# Patient Record
Sex: Male | Born: 1940 | Race: Black or African American | Hispanic: No | Marital: Married | Smoking: Light tobacco smoker
Health system: Southern US, Community
[De-identification: ages and names within clinical notes are randomized; demographics above are authoritative.]

## PROBLEM LIST (undated history)

## (undated) DIAGNOSIS — E785 Hyperlipidemia, unspecified: Secondary | ICD-10-CM

## (undated) DIAGNOSIS — K5790 Diverticulosis of intestine, part unspecified, without perforation or abscess without bleeding: Secondary | ICD-10-CM

## (undated) DIAGNOSIS — Z860100 Personal history of colon polyps, unspecified: Secondary | ICD-10-CM

## (undated) DIAGNOSIS — H409 Unspecified glaucoma: Secondary | ICD-10-CM

## (undated) DIAGNOSIS — G4730 Sleep apnea, unspecified: Secondary | ICD-10-CM

## (undated) DIAGNOSIS — K219 Gastro-esophageal reflux disease without esophagitis: Secondary | ICD-10-CM

## (undated) DIAGNOSIS — K118 Other diseases of salivary glands: Secondary | ICD-10-CM

## (undated) DIAGNOSIS — T7840XA Allergy, unspecified, initial encounter: Secondary | ICD-10-CM

## (undated) DIAGNOSIS — F329 Major depressive disorder, single episode, unspecified: Secondary | ICD-10-CM

## (undated) DIAGNOSIS — F419 Anxiety disorder, unspecified: Secondary | ICD-10-CM

## (undated) DIAGNOSIS — F4310 Post-traumatic stress disorder, unspecified: Secondary | ICD-10-CM

## (undated) DIAGNOSIS — G4700 Insomnia, unspecified: Secondary | ICD-10-CM

## (undated) DIAGNOSIS — Z9289 Personal history of other medical treatment: Secondary | ICD-10-CM

## (undated) DIAGNOSIS — M1990 Unspecified osteoarthritis, unspecified site: Secondary | ICD-10-CM

## (undated) DIAGNOSIS — I10 Essential (primary) hypertension: Secondary | ICD-10-CM

## (undated) DIAGNOSIS — M549 Dorsalgia, unspecified: Secondary | ICD-10-CM

## (undated) DIAGNOSIS — K922 Gastrointestinal hemorrhage, unspecified: Secondary | ICD-10-CM

## (undated) DIAGNOSIS — F32A Depression, unspecified: Secondary | ICD-10-CM

## (undated) DIAGNOSIS — R350 Frequency of micturition: Secondary | ICD-10-CM

## (undated) HISTORY — PX: PARTIAL COLECTOMY: SHX5273

## (undated) HISTORY — PX: COLONOSCOPY: SHX174

## (undated) HISTORY — DX: Allergy, unspecified, initial encounter: T78.40XA

---

## 1999-12-19 ENCOUNTER — Emergency Department (HOSPITAL_COMMUNITY): Admission: EM | Admit: 1999-12-19 | Discharge: 1999-12-19 | Payer: Self-pay | Admitting: Emergency Medicine

## 2000-12-06 ENCOUNTER — Emergency Department (HOSPITAL_COMMUNITY): Admission: EM | Admit: 2000-12-06 | Discharge: 2000-12-06 | Payer: Self-pay | Admitting: Emergency Medicine

## 2000-12-06 ENCOUNTER — Encounter: Payer: Self-pay | Admitting: Emergency Medicine

## 2001-03-03 ENCOUNTER — Encounter: Payer: Self-pay | Admitting: Emergency Medicine

## 2001-03-03 ENCOUNTER — Inpatient Hospital Stay (HOSPITAL_COMMUNITY): Admission: EM | Admit: 2001-03-03 | Discharge: 2001-03-06 | Payer: Self-pay | Admitting: Emergency Medicine

## 2002-09-20 ENCOUNTER — Inpatient Hospital Stay (HOSPITAL_COMMUNITY): Admission: EM | Admit: 2002-09-20 | Discharge: 2002-09-23 | Payer: Self-pay | Admitting: Emergency Medicine

## 2002-09-22 ENCOUNTER — Encounter (INDEPENDENT_AMBULATORY_CARE_PROVIDER_SITE_OTHER): Payer: Self-pay | Admitting: Specialist

## 2003-02-10 ENCOUNTER — Inpatient Hospital Stay (HOSPITAL_COMMUNITY): Admission: EM | Admit: 2003-02-10 | Discharge: 2003-02-13 | Payer: Self-pay | Admitting: Emergency Medicine

## 2005-10-02 ENCOUNTER — Emergency Department (HOSPITAL_COMMUNITY): Admission: EM | Admit: 2005-10-02 | Discharge: 2005-10-02 | Payer: Self-pay | Admitting: Emergency Medicine

## 2006-03-16 ENCOUNTER — Inpatient Hospital Stay (HOSPITAL_COMMUNITY): Admission: EM | Admit: 2006-03-16 | Discharge: 2006-03-19 | Payer: Self-pay | Admitting: Emergency Medicine

## 2006-03-16 ENCOUNTER — Ambulatory Visit: Payer: Self-pay | Admitting: Gastroenterology

## 2006-03-20 ENCOUNTER — Inpatient Hospital Stay (HOSPITAL_COMMUNITY): Admission: EM | Admit: 2006-03-20 | Discharge: 2006-03-24 | Payer: Self-pay | Admitting: Emergency Medicine

## 2006-03-23 ENCOUNTER — Encounter (INDEPENDENT_AMBULATORY_CARE_PROVIDER_SITE_OTHER): Payer: Self-pay | Admitting: *Deleted

## 2006-04-01 ENCOUNTER — Ambulatory Visit: Payer: Self-pay | Admitting: Gastroenterology

## 2006-04-06 ENCOUNTER — Ambulatory Visit: Payer: Self-pay | Admitting: Gastroenterology

## 2007-06-01 ENCOUNTER — Encounter: Payer: Self-pay | Admitting: Emergency Medicine

## 2007-06-02 ENCOUNTER — Inpatient Hospital Stay (HOSPITAL_COMMUNITY): Admission: RE | Admit: 2007-06-02 | Discharge: 2007-06-07 | Payer: Self-pay | Admitting: Internal Medicine

## 2007-06-09 ENCOUNTER — Inpatient Hospital Stay (HOSPITAL_COMMUNITY): Admission: EM | Admit: 2007-06-09 | Discharge: 2007-06-14 | Payer: Self-pay | Admitting: Emergency Medicine

## 2007-08-20 ENCOUNTER — Encounter (INDEPENDENT_AMBULATORY_CARE_PROVIDER_SITE_OTHER): Payer: Self-pay | Admitting: General Surgery

## 2007-08-20 ENCOUNTER — Inpatient Hospital Stay (HOSPITAL_COMMUNITY): Admission: RE | Admit: 2007-08-20 | Discharge: 2007-08-25 | Payer: Self-pay | Admitting: General Surgery

## 2009-08-31 ENCOUNTER — Encounter (INDEPENDENT_AMBULATORY_CARE_PROVIDER_SITE_OTHER): Payer: Self-pay | Admitting: *Deleted

## 2010-02-21 ENCOUNTER — Telehealth: Payer: Self-pay | Admitting: Gastroenterology

## 2011-01-23 NOTE — Progress Notes (Signed)
Summary: Schedule Colonoscopy  Phone Note Outgoing Call Call back at [facility] Phone [phone]   Call placed by: Harlow Mares CMA Duncan Dull),  February 21, 2010 3:31 PM Call placed to: Patient Summary of Call: patients number has been disconnected, we will mail him another letter about his colonoscopy recall . Initial call taken by: Harlow Mares CMA Duncan Dull),  February 21, 2010 3:32 PM

## 2011-05-06 NOTE — Consult Note (Signed)
Jeremiah [name], STOFFERS NO.:  [id number]   MEDICAL RECORD NO.:  [id number]          PATIENT TYPE:  INP   LOCATION:  2901                         FACILITY:  [facility]   PHYSICIAN:  Dr. Janee Morn           DATE OF BIRTH:  [birth year]   DATE OF CONSULTATION:  06/04/2007  DATE OF DISCHARGE:                                 CONSULTATION   CONSULTING SURGEONS:  Dr. Cain Saupe and Dr. Janee Morn.   PRIMARY CARE PHYSICIAN:  In [place].   GI PHYSICIAN:  Iva Boop, MD,FACG   REASON FOR CONSULTATION:  Recurrent diverticular bleed.   HISTORY OF PRESENT ILLNESS:  Jeremiah [name] is a 70 year old very pleasant  male with history of diverticular bleed beginning in 2000.  Several of  these have required inpatient treatment and packed red blood cell  therapy.  He has had colonoscopies which have identified diverticulosis,  but no actual sites of diverticular bleeding.  Dr. Zachery Dakins was  actually called in to evaluate him during a hospitalization in April  2007 because of GI bleeding.  At that admission the patient had been  quiescent, regarding bleeding, for about 2 years until that admission.  The patient was readmitted, at this time, on June 02, 2007 with  recurrent bright red blood and maroon blood per rectum.  The bleeding  has slowed intermittently.  The patient will have brown stools followed  by red-or-maroon stools.  The patient reports at least some amount of  red blood daily per rectum.  He usually reports crampy abdominal pain;  and then has bleeding symptoms.  He has been evaluated by GI this  admission who feel, at this time, that he needs to be observed further;  and is not recommending colonoscopy, EGD of bleeding scan at this point.  The patient's last colonoscopy was in 2007; and, again, demonstrated  diverticulosis without active diverticular bleeding.   Internal medicine is concerned because of the patient's hemoglobin at  admission was 11.4 and has slowly been  trending downward with a  hemoglobin, today, of 8.5.  The patient has received a total of 5 units  of packed red blood cells this admission including 1 unit today; and he  has another unit pending.  Internal medicine is requesting a surgical  evaluation.   REVIEW OF SYSTEMS:  As above.  The patient states that this admission  has been the worst regarding recurrence of bleeding since he has begun  having problems in about 2000.   PAST MEDICAL HISTORY:  1. Hypertension.  2. Osteoarthritis.  The patient denies NSAID use.  3. Diverticulosis with recurrent diverticular bleeds.  No actual      anatomical site identified.   PAST SURGICAL HISTORY:  Colonoscopy with polypectomy.   SOCIAL HISTORY:  Married.  He smokes cigarettes one pack per day.  No  alcohol.   ALLERGIES:  He is allergic to unknown antibiotic.   MEDICATIONS:  The patient is taking Cipro, potassium, Protonix,  Dilaudid, Lopressor, Phenergan, Reglan and Zofran this admission.   PHYSICAL EXAM:  GENERAL:  A pleasant male  patient currently complaining  of a slightly mild crampy abdominal pain, but has not had any bright red  bleeding since about 7:30 this morning.  VITAL SIGNS:  Temperature 97.7, BP 143/90, pulse 96, respirations 20.  NEUROLOGIC:  The patient is alert and oriented x3 moving all extremities  x4.  HEENT:  Head normocephalic.  Sclerae not injected.  NECK:  Supple without adenopathy.  CHEST:  Bilateral lung sounds clear to auscultation.  Respiratory effort  is nonlabored.  CARDIAC:  S1-S2, no rubs, murmurs, thrills, or gallops.  Pulses regular.  Sinus rhythm on the bedside monitor.  Apparently the patient had an  issue with some hypotension and tachycardia while anemic earlier this  hospitalization.  ABDOMEN:  Soft, nondistended.  Active bowel sounds, nontender, no  obvious hepatosplenomegaly, masses, or bruits.  No hernias.  EXTREMITIES:  Symmetrical in appearance.  Without edema, cyanosis, or   clubbing.   LAB:  Admission hemoglobin 11.4 and has slowly trended down until his  hemoglobin is 8.5 today, INR 1.1.   DIAGNOSTICS:  Chest x-ray shows no acute process.   IMPRESSION:  1. Recurrent lower GI bleeding secondary to known diverticular      disease.  2. Blood loss anemia secondary to diverticular disease requiring      packed red blood cells this admission.  3. Hypertension.   PLAN:  We will go ahead and get a tagged red blood cell study today  since the patient appears to have somewhat of an active bleeding.  We  need to locate the anatomic region in the colon of the bleeding.  The  patient continues to bleed this admission; he will need a colectomy this  admission; based on anatomic region; and if the bleeding stops, this tag  cell study will at least help [place] to no the area of colon that needs  resecting for elective colectomy in the future.   If tagged red blood cell study is unrevealing, the patient may need to  go ahead and undergo colonoscopy especially if he continuous to bleed.      Allison L. Rennis Harding, N.P.    ______________________________  Dr. Janee Morn    ALE/MEDQ  D:  06/04/2007  T:  06/05/2007  Job:  [id number]

## 2011-05-06 NOTE — Discharge Summary (Signed)
NAME:  [name], [name] NO.:  [id number]   MEDICAL RECORD NO.:  [id number]          PATIENT TYPE:  INP   LOCATION:  4735                         FACILITY:  [facility]   PHYSICIAN:  Marcellus Scott, MD     DATE OF BIRTH:  1941/01/26   DATE OF ADMISSION:  06/01/2007  DATE OF DISCHARGE:                               DISCHARGE SUMMARY   PRIMARY CARE PHYSICIAN:  Dr. Ventura Sellers, [address], [place],  [place].   Gastroenterologist at [facility].   DISCHARGE DIAGNOSES:  1. Recurrent lower gastrointestinal bleeding, likely diverticular.  2. Acute blood loss anemia.  3. Thrombocytopenia.  4. Leukocytosis.  5. Hypertension.  6. Gastroesophageal reflux disease.   DISCHARGE MEDICATIONS:  1. Prilosec over-the-counter 20 mg p.o. daily.  2. Ferrous sulfate 325 mg p.o. b.i.d.  3. Tylenol Sinus p.r.n.   PROCEDURES:  1. Colonoscopy by Dr. Ewing Schlein, please refer to his procedure notes for      details.  2. Nuclear medicine gastrointestinal bleeding scan on June 13.      Impression, no evidence of GI bleeding by nuclear medicine scan.  3. Chest x-ray on June 10.  Impression is no acute abnormalities.   PERTINENT LABORATORY DATA:  CBC:  Hemoglobin 10.4, hematocrit 30.6,  white blood cell 9.9, platelets 215.   Basic metabolic panel:  Normal with BUN of 4, creatinine 0.99.  Hemoglobin A1C 5.5, hepatic panel remarkable for total protein 5.3 and  albumin 3.0.   CONSULTATIONS:  1. Gastroenterology from [facility], Dr. Ewing Schlein.  2. Surgery, [facility], Dr. Carolynne Edouard.   HOSPITAL COURSE/PATIENT DISPOSITION:  For the details of the initial  part of the admission, please refer to the History and Physical note  that was done by Dr. Darnelle Catalan.  In summary, Jeremiah [name] is a pleasant 66-year-  old African American male patient with history of recurrent GI bleeding  secondary to diverticular disease with similar hospitalizations in the  past secondary to rectal bleeding who  presented with bright red bleeding  per rectum.  He also was symptomatic with dizziness, presyncope and  generalized weakness.  He was thereby admitted to the hospital for  further evaluation and management.   #1 - RECURRENT GASTROINTESTINAL BLEED, LIKELY DIVERTICULAR IN ORIGIN.  The patient was initially admitted to the telemetry bed.  His H&H's were  monitored frequently.  Two large bore IV accesses were placed.  Gastroenterology was consulted.  The patient was placed on a Proton pump  inhibitor.  He was resuscitated with IV fluids and also was transfused  packed red blood cells.  However, after transfusion of four units of  packed red blood cells, the patient continued to bleed and continued to  complain of generalized weakness, dizziness and was noted to be preshock  in nature.  The patient was therefore transferred to the stepdown unit.  He was made n.p.o.  He got a total transfusion of 8 units of packed red  blood cells.  GI and surgery continued to follow the patient.  GI  subsequently did a colonoscopy which revealed small internal and  external hemorrhoids, two  distal to mid tiny sigmoid diverticular seen  without clot or signs of bleeding, increased cecum and ascending  diverticula, small polyp in the proximal descending which is not  significant, ileocecal valve with minimal inflammation, otherwise,  normal limits to the terminal ileum without any active bleeding seen or  any clots of adverse lesions.  Very minimal blood in the colon, but it  was a little more pronounced in the left than the right of questionable  significance.  At this point, the patient was transitioned to clear  liquids and his H&H's were frequently monitored.  The patient had a BM  which was soft, brown stools, and since two days, the patient has not  had any further BM.  His H&H's continued to initially drop slightly, but  have come up and have remained stable.  The patient is asymptomatic this  time, and  will be discharged on iron supplements.  He is advised to seek  immediate medical attention if he has any further similar events.  He is  also to follow up with his primary medical doctor with repeat CBC later  this week.  The patient also has to follow up with GI in a year's time  for another colonoscopy.  If the patient continues to have these  repeated episodes he will eventually need hemicolectomy or colectomy.   #2 - ACUTE BLOOD LOSS ANEMIA SECONDARY TO GI BLEED.  Status post 8 units  of packed red blood cells.  His hemoglobin can be monitored as an  outpatient in a couple of days, and the patient will be placed on iron  sulfate.   #3 - THROMBOCYTOPENIA.  The patient's platelets had dropped in the 130s.  However, this was felt to be secondary to his multiple packed red blood  cell transfusions and they have come up and stabilized.   #4 - LEUKOCYTOSIS WHICH IS PROBABLY STRESS RELATED WITH NO CLINICAL  FOCUS OF SEPSIS.  However, the patient was briefly placed on  Ciprofloxacin, and that has been discontinued.   #5 - HYPERTENSION.  The patient's home antihypertensive medications were  held and his blood pressures were currently normal.  His blood pressure  medications will continue to be held until he is followed up as an  outpatient .   #6 - GASTROESOPHAGEAL REFLUX DISEASE.  The patient is to continue PPIs  as an outpatient.      Marcellus Scott, MD  Electronically Signed     AH/MEDQ  D:  06/07/2007  T:  06/07/2007  Job:  [id number]   cc:   Nunzio Cory, [place] Dr. Farris Has [place]  Petra Kuba, M.D.  Ollen Gross. Vernell Morgans, M.D.

## 2011-05-06 NOTE — H&P (Signed)
Jeremiah [name], Jeremiah [name] NO.:  [id number]   MEDICAL RECORD NO.:  [id number]          PATIENT TYPE:  INP   LOCATION:  5715                         FACILITY:  [facility]   PHYSICIAN:  Hillery Aldo, M.D.   DATE OF BIRTH:  1941/08/01   DATE OF ADMISSION:  06/09/2007  DATE OF DISCHARGE:                              HISTORY & PHYSICAL   PRIMARY CARE PHYSICIAN:  Unassigned.   GASTROENTEROLOGIST:  Iva Boop, MD,   CHIEF COMPLAINT:  Recurrent rectal bleeding.   HISTORY OF PRESENT ILLNESS:  The patient is a 70 year old male with  known history of diverticulosis who was recently admitted to [facility] from June 01, 2007, through June 07, 2007, for evaluation of  the lower GI bleed.  During that admission, he had a total of 8 units of  packed red blood cells for his rectal bleeding and was evaluated by both  the gastroenterologist and by general surgery.  His workup included a  colonoscopy which showed diverticulosis and some stigmata of recent  bleeding but no active bleeding.  He also had a tagged RBC scan which  did not show a source of the blood loss.  According to laboratory data,  his discharge hemoglobin was 10.4.  He now returns with blood in his  stool starting today.  There has been some clots as well.  The patient  states that he has not moved his bowels in several days and took an  herbal laxative.  He experienced some abdominal cramping and now has  blood in the stool.  He denies any dizziness or presyncope.  Given the  seriousness of his prior lower GI bleed, we are admitting him for  further evaluation.   FAMILY HISTORY:  Reviewed and unchanged.   SOCIAL HISTORY:  Reviewed and unchanged.   ALLERGIES:  No known drug allergies except for an UNSPECIFIED ANTIBIOTIC  which causes nausea and vomiting.   CURRENT MEDICATIONS:  1. Prilosec 20 mg daily.  2. Ferrous sulfate 325 mg b.i.d.  3. Tylenol Sinus p.r.n.  4. Herbal laxative p.r.n.  5.  Zestoretic 20/25 one tablet daily.   REVIEW OF SYSTEMS:  The patient denies any fever or chills.  There has  not been any chest pain, shortness of breath, dysuria, or other  symptoms.   PHYSICAL EXAMINATION:  VITAL SIGNS:  Temperature 96.5, pulse 137,  respirations 18, blood pressure 156/101, O2 saturation 99% on room air.  GENERAL:  Well-developed, well-nourished male in no acute distress.  HEENT: Normocephalic, atraumatic.  PERRL.  EOMI.  Oropharynx is clear.  NECK:  Supple, no thyromegaly, no lymphadenopathy, no jugular venous  distension.  CHEST:  Lungs clear to auscultation bilaterally with good air movement.  HEART:  Tachycardiac rate, regular rhythm.  No murmurs, rubs, or  gallops.  ABDOMEN:  Soft.  There are hyperactive bowel sounds.  Nontender.  EXTREMITIES:  No clubbing, edema, cyanosis.  SKIN:  Warm and dry.  No rashes.  NEUROLOGIC:  The patient is alert and oriented x3.  Cranial nerves II-  XII grossly intact.  Nonfocal.   DATA REVIEW.:  White blood cell count is 8.5, hemoglobin 8.7, hematocrit  26.1, platelets 247 with an MCV of 93.1.  Sodium is 138, potassium 3.3,  chloride 108, bicarb 25, BUN 9, creatinine 1.15, glucose 136, total  protein 5.0, albumin 2.7, AST 19, ALT 16, total bilirubin 0.4, alkaline  phosphatase 45.  Coagulation studies are normal.   ASSESSMENT/PLAN:  1. Recurrent gastrointestinal bleed:  We will admit the patient and      start him on fluid volume resuscitation.  I will check a CBC q.6 h      and transfuse if needed.  Will start two large bore IVs and monitor      him closely for signs of decompensation or hemodynamic instability.      We will reconsult gastroenterology and general surgery in the      morning for consideration of further treatment options.  At this      time, he will likely need a colectomy given his recurrent problems      with GI bleeding.  Will keep him n.p.o. and put him on IV proton      pump inhibitor therapy.  2.  Hypokalemia:  Likely due to GI losses.  Will replete in his IV      fluids.  3. Hypertension:  Will hold the patient's antihypertensives for now.  4. Prophylaxis:  Initiate GI prophylaxis with Protonix and DVT      prophylaxis PAS hose.      Hillery Aldo, M.D.  Electronically Signed     CR/MEDQ  D:  06/09/2007  T:  06/09/2007  Job:  [id number]   cc:   Iva Boop, MD,FACG

## 2011-05-06 NOTE — H&P (Signed)
NAMEMORY, HERRMAN NO.:  [id number]   MEDICAL RECORD NO.:  [id number]           PATIENT TYPE:   LOCATION:                                 FACILITY:   PHYSICIAN:  Hillery Aldo, M.D.   DATE OF BIRTH:  12/16/1941   DATE OF ADMISSION:  06/01/2007  DATE OF DISCHARGE:                              HISTORY & PHYSICAL   PRIMARY CARE PHYSICIAN:  Unassigned.  The patient attends [employer].   GASTROENTEROLOGIST:  Iva Boop, MD, [facility]   CHIEF COMPLAINT:  Rectal bleeding.   HISTORY OF PRESENT ILLNESS:  The patient is 70 year old male with past  medical history of recurrent lower GI bleeding thought to be due to  diverticular etiology.  He has had similar hospitalizations in the past  for significant brisk rectal bleeding.  The patient presents today with  dark stool that turned bright red and a bloody over the past several  hours beginning at 6:30 p.m. tonight.  Since being admitted to the  emergency department, he has had three bloody stools here.  The patient  does endorse presyncopal dizziness but has not actually had any syncopal  events.  He feels weak.  He is being admitted for stabilization and  consultation with gastroenterology   PAST MEDICAL HISTORY:  1. Hypertension.  2. Diverticulosis with diverticular bleeding and three      hospitalizations in the past.  3. Gastroesophageal reflux disease with esophageal stricture.  4. History of hyperplastic colon polyps.  5. Acute blood loss anemia requiring blood transfusions.  6. Osteoarthritis.  7. Chronic allergic sinusitis.   FAMILY HISTORY:  The patient's mother died at age 43 from complications  of childbirth.  The patient's father died in his 41s from what he thinks  was a stroke.  He has two brothers who are deceased.  Both of them had  diabetes, and he thinks one had a massive heart attack.  Three sisters  that are alive and healthy.   SOCIAL HISTORY:  The patient is married and lives with his  wife in  [place].  He is retired from custodial work.  He smokes about a pack  of cigarettes every week.  He rarely drinks alcohol.  He has six healthy  offspring.   ALLERGIES:  The patient has no specific drug allergies but did report  that an antibiotic given to him by the [facility] physician did cause nausea and  vomiting as well as diarrhea, and he is, therefore, intolerant to it,  although he could not remember the name of this ANTIBIOTIC.   CURRENT MEDICATIONS:  1. Zestoretic 20/25 1 tablet daily.  2. Tylenol Cold and Sinus as needed.   REVIEW OF SYSTEMS:  The patient's appetite has been good.  He has had  steady weight gain over time.  He denies any chest pain or shortness of  breath.  He has chronic sinus congestion and cough related to postnasal  drip.  He is chronically constipated and uses Metamucil for this.  Up  until this recent episode, his bowels have been moving normally for him.  He  denies any nausea or vomiting.  No dysuria.   PHYSICAL EXAMINATION:  VITAL SIGNS:  Temperature 98.1, pulse 88,  respirations 18, blood pressure 129/81, O2 saturation 98% on 2 liters.  General:  Well-developed, well-nourished male in no acute distress.  HEENT:  Normocephalic, atraumatic.  PERRLA.  EOMI.  Oropharynx is clear  with moist mucous membranes.  NECK:  Supple, no thyromegaly, no lymphadenopathy, no jugular venous  distension.  CHEST:  Lungs clear to auscultation bilaterally with good air movement.  HEART:  Regular rate and rhythm.  No murmurs, rubs, or gallops.  ABDOMEN:  Soft, nontender, nondistended with somewhat hyperactive bowel  sounds.  No masses.  EXTREMITIES:  No clubbing, edema, or cyanosis.  SKIN:  Warm and dry.  No rashes.  NEUROLOGIC:  The patient is alert and oriented x3.  Cranial nerves II-  XII grossly tact.  Moves all extremities x4 with equal strength.  Nonfocal.   DATA REVIEW.:  Chest x-ray is negative for acute changes.   A 12-lead EKG shows normal sinus  rhythm with nonspecific ST wave  abnormalities.   Laboratory data:  White blood cell count is 11.6, hemoglobin 11.4,  hematocrit 32.2, platelets 225 with an MCV of 94.9.  Sodium is 137,  potassium 3.4, chloride 104, bicarb 27, BUN 12, creatinine 1.17, glucose  171, total bilirubin 0.5, total protein 5.3, albumin 3.0, alkaline  phosphatase 37, AST 15, ALT 14.  Lipase is 20.  PT 13.6.   ASSESSMENT/PLAN:  1. Recurrent lower gastrointestinal bleed:  We will admit the patient      and fluid volume resuscitate him.  Will check a CBC q.6 h and      transfuse if needed.  Will start two large bore IVs and monitor him      closely for hemodynamic stability on the step-down unit.  [facility] has already been appraised of the patient's      admission by the ED physician.  Would consider a surgical      consultation for partial colectomy given the patient's recurrent      lower GI bleeding.  We will put him on proton pump inhibitor      therapy and bowel rest.  2. Hypokalemia:  This is likely due to GI losses.  Will replete his IV      fluids.  3. Hyperglycemia:  The patient is hyperglycemic, but he has not been      fasting.  Will monitor his CBGs q.6 h for 24 hours and check a      hemoglobin A1c to determine if he has any problems with impaired      glucose tolerance.  4. Hypertension:  Will hold the patient's antihypertensives for now.  5. Prophylaxis:  Will initiate GI prophylaxis with Protonix and DVT      prophylaxis with PAS hose.      Hillery Aldo, M.D.  Electronically Signed     CR/MEDQ  D:  06/01/2007  T:  06/02/2007  Job:  [id number]

## 2011-05-06 NOTE — Op Note (Signed)
NAMEWIRT, HEMMERICH NO.:  [id number]   MEDICAL RECORD NO.:  [id number]          PATIENT TYPE:  INP   LOCATION:  5712                         FACILITY:  [facility]   PHYSICIAN:  Gabrielle Dare. Janee Morn, M.D.DATE OF BIRTH:  08-27-1941   DATE OF PROCEDURE:  08/20/2007  DATE OF DISCHARGE:                               OPERATIVE REPORT   PREOPERATIVE DIAGNOSIS:  Recurrent diverticular hemorrhage, right colon.   POSTOPERATIVE DIAGNOSIS:  Recurrent diverticular hemorrhage, right  colon.   PROCEDURE:  Right colectomy.   SURGEON:  Gabrielle Dare. Janee Morn, M.D.   ANESTHESIA:  General.   HISTORY OF PRESENT ILLNESS:  Mr. Jeremiah [name] is a 70 year old African American  gentleman with a history of hypertension who has had recurrent bouts of  right sided diverticular hemorrhage.  He has undergone medical clearance  for surgery and now presents today for elective colectomy.  He underwent  a colon prep.   PROCEDURE IN DETAIL:  Informed consent was obtained.  The patient  received intravenous antibiotics.  His site was marked after identifying  him in the preoperative holding area, he was brought to the operating  room.  General anesthesia was administered by the anesthesia staff.  His  abdomen was prepped and draped in a sterile fashion.  A midline incision  was made.  Subcutaneous tissues were dissected down revealing the  anterior fascia.  This was divided along the linea alba.  The peritoneal  cavity was entered under direct vision without difficulty.  The fascia  was opened the length of the incision.  Exploration revealed some  adhesions of the omentum down to the cecum.  These were cleared away  with careful dissection.  There were visible diverticula on the cecum.  Once the omentum was freed away, the right colon was then further  inspected.  The diverticula seemed to be localized mostly to the cecum  and very proximal right colon.  The liver was palpated and it was  smooth.  The right  colon was then mobilized from its lateral peritoneal  attachments along the white line of T[place]t and mobilization continued up  and the hepatic flexure was also mobilized.  We protected the duodenum  throughout.  Once adequate mobilization was obtained, an area just  proximal to midline of transverse colon was selected.  The omentum was  cleared away with the LigaSure, getting excellent hemostasis.  The colon  was divided with a GIA-75 stapler.  We then gradually took down the  mesentery with the LigaSure, obtaining excellent hemostasis.  We  completed about two-thirds of the mesenteric division.  Subsequently,  the terminal ileum was divided with the GIA-75 stapler and the remainder  of the mesentery to the cecum and right colon was taken down a LigaSure,  getting excellent hemostasis.  The specimen was passed off and sent to  pathology.  The mesentery was closely inspected and meticulous  hemostasis was ensured.  The abdomen was irrigated.  Right upper  quadrant was checked and there was no hemorrhage.  We then reconnected  the bowel.  We first placed the small bowel back into  anatomic position.  A side-to-side anastomosis was made between the ileum and transverse  colon with GIA-75 stapler.  A crotch stitch of 0 silk suture had been  placed.  The resultant enterotomy was closed with a TA-60 after ensuring  excellent hemostasis along the staple line.  Hemostasis was obtained on  this staple line with one figure-of-eight 3-0 silk.  The anatomosis was  widely patent.  A second crotch stitch of 2-0 silk was placed for  support.  The mesenteric defect was then closed with interrupted 2-0  silk sutures.  We changed our gloves.  We then irrigated the abdomen  with several liters of warm saline.  Irrigation fluid returned clear.  Right upper quadrant was reinspected and hemostasis was present.  The  remainder of the abdomen was irrigated, irrigation fluid returned clear.  The anastomosis was  placed in anatomic position in the upper abdomen.  The remainder of the bowel was placed back in anatomic position.  Sponge, needle and instrument counts were ensured to be correct.  We  checked the right upper quadrant one more time and remained completely  dry.  The abdomen was then closed with 2 lengths of running #1 PDS  suture from each end and tied to the middle.  Subcutaneous tissues were  copiously irrigated and the skin was closed with staples.  Sponge,  needle and instrument counts were again correct.  A sterile dressing was  applied.  The patient tolerated the procedure well without apparent  complication, was taken to the recovery room in stable condition.      Gabrielle Dare Janee Morn, M.D.  Electronically Signed     BET/MEDQ  D:  08/20/2007  T:  08/20/2007  Job:  [id number]   cc:   Deirdre Peer. Polite, M.D.  Graylin Shiver, M.D.

## 2011-05-06 NOTE — Op Note (Signed)
NAMEJEREMIE, Jeremiah [name] NO.:  [id number]   MEDICAL RECORD NO.:  [id number]          PATIENT TYPE:  INP   LOCATION:  2901                         FACILITY:  [facility]   PHYSICIAN:  Petra Kuba, M.D.    DATE OF BIRTH:  [birth year]   DATE OF PROCEDURE:  06/05/2007  DATE OF DISCHARGE:                               OPERATIVE REPORT   PROCEDURE:  Colonoscopy.   INDICATIONS:  Persistent GI bleeding in a patient with known  diverticular disease.   Consent was signed after risks, benefits, methods, options thoroughly  discussed by multiple GI physicians in the past as well as the [facility]  team.   MEDICINES USED:  Fentanyl 100 mcg, Versed 10 mg.   PROCEDURE:  Rectal inspection is pertinent for small external  hemorrhoids.  Digital exam was negative.  The video colonoscope was  inserted and fairly easily advanced around the colon to the cecum.  There was a little bit of old blood in the left side with much less on  the right side but no signs of active bleeding.  To advance to the cecum  required rolling on his back and abdominal pressure.  On insertion, no  abnormalities were seen until we got to the ascending colon where  moderate diverticula were seen.  There was a little bit of inflammation  on the IC valve of questionable significance.  The area was washed and  watched but could not be made to bleed.  It did not look like an AVM.  The cecum was normal which was identified by the appendiceal orifice and  the ileocecal valve.  In fact, the scope was inserted short ways in the  terminal ileum which was normal.  Photo documentation was obtained.  The  scope was slowly withdrawn.  The prep was adequate.  There was some  liquid stool, water and old blood that required washing and suctioning  but on slow withdrawal through the colon, the right-sided diverticula  were moderate.  No obvious clots were seen nor any active or fresh  blood.  The transverse was normal.  In the  more proximal descending, a  small nonsignificant polyp was seen.  Based on multiple rebleeding  episodes, elected not to biopsy at this time.  On slow withdrawal  through the colon, there was a rare distal to mid tiny sigmoid  diverticula seen but no signs of bleeding or any other abnormalities as  we withdrew back to the rectum.  Anorectal pull-through and retroflexion  confirmed some small hemorrhoids.  Scope was straightened, readvanced a  short ways up the left side of the colon.  Air was suctioned, scope  removed.   The patient tolerated the procedure well.  There was no obvious  immediate complication.   ENDOSCOPIC DIAGNOSES:  1. Small internal/external hemorrhoids.  2. Two distal to mid tiny sigmoid diverticula seen without clot or      signs of bleeding.  3. Increased cecum and ascending diverticula.  4. Small polyp in the proximal descending, nonsignificant.  5. Ileocecal valve with minimal inflammation, could not be made to  bleed with washing.  Questionable significance.  6. Otherwise within normal limits to the terminal ileum without any      active bleeding seen nor any clots or adverse lesion.  Very minimal      blood in the colon but it was a little more pronounced in the left      than the right of questionable significance.   PLAN:  I have discussed the case with Dr. Carolynne Edouard.  Clear liquids for now.  Continue to follow H and H.  Would probably recommend a repeat nuclear  scan p.r.n. and probably if surgical options were needed due to  continued bleeding.  Consider a right hemicolectomy only just see how he  does.  Will go ahead and plan a repeat colonoscopy in 1 year to remove  the polyp at that time since it had no significant worrisome lesions.  Will follow with you.  Hold aspirin and other blood thinners and  nonsteroidals, etc.           ______________________________  Petra Kuba, M.D.     MEM/MEDQ  D:  06/05/2007  T:  06/06/2007  Job:  [id number]    cc:   Petra Kuba, M.D.  Olene Craven, M.D.

## 2011-05-06 NOTE — Discharge Summary (Signed)
NAMEVERDELL, DYKMAN NO.:  [id number]   MEDICAL RECORD NO.:  [id number]          PATIENT TYPE:  INP   LOCATION:  5715                         FACILITY:  [facility]   PHYSICIAN:  Isidor Holts, M.D.  DATE OF BIRTH:  Nov 27, 1941   DATE OF ADMISSION:  06/09/2007  DATE OF DISCHARGE:  06/14/2007                         DISCHARGE SUMMARY - REFERRING   PRIMARY CARE PHYSICIAN:  Deirdre Peer. Polite, M.D.   DISCHARGE DIAGNOSES:  1. Extensive diverticular disease.  2. Recurrent lower gastrointestinal bleed presumed of diverticular      origin.  3. History of gastroesophageal reflux disease/esophageal stricture      status post dilatation in the past.  4. Osteoarthritis.  5. Chronic sinusitis.  6. Smoking history.  7. Acute blood loss anemia, required blood transfusion.  8. Hypertension.   DISCHARGE MEDICATIONS:  .  1. Zestoretic (20/25) one p.o. daily.  2. Prilosec 20 mg p.o. daily.  3. Ferrous sulfate 325 mg p.o. b.i.d.   PROCEDURES:  1. Upper GI endoscopy June 10, 2007, performed by Dr. Graylin Shiver.      This showed small hiatal hernia but was otherwise normal.  2. Capsule endoscopy dated June 11, 2007.  This was an entirely normal      examination.   CONSULTATIONS:  1. Graylin Shiver, M.D., gastroenterologist.  2. [place]. Daphine Deutscher, MD, general surgeon.  3. Shirley Friar, MD, gastroenterology.   ADMISSION HISTORY:  As in H&P dated June 09, 2007, dictated by Hillery Aldo, M.D., however, in brief, this is a 70 year old male with known  history of diverticulosis, status post multiple hospitalizations with GI  bleed.  Latest hospitalization was June 01, 2007, to June 07, 2007.  At  that time, he underwent a colonoscopy which demonstrated small internal  and external hemorrhoids, diverticula without clot or signs of active  bleeding, small polyp in the proximal descending colon.  Nuclear  medicine scan done on June 04, 2007, showed no evidence of GI  bleeding.  Past medical history is significant for osteoarthritis, previous  esophageal stricture, status post dilatation, chronic sinusitis, smoking  history. He now represents with recurrent passage of blood in the  stools, and was admitted for further evaluation, investigation and  management.   CLINICAL COURSE:  PROBLEM #1 -  RECURRENT LOWER GASTROINTESTINAL BLEED:  Patient has been extensively evaluated for this in the recent past,  including nuclear medicine scan June 04, 2007, which was negative and a  colonoscopy June 05, 2007, which showed extensive diverticular disease  but no active source of bleeding.  During his last hospitalization, he  required transfusion of about 8 units packed red blood cells.  He now  presents with recurrent symptoms and anemia with hemoglobin 8.7.  However, remained otherwise hemodynamically stable.  He was transfused  with two units packed red blood cells resulting in a satisfactory bump  in hemoglobin level.  As of June 14, 2007, his hemoglobin was 10.4 with  hematocrit 31.  GI consultation was called which was provided by Dr.  Herbert Moors and Dr. Bosie Clos.  Patient underwent upper GI endoscopy on  June 10, 2007, which showed a small hiatal hernia but was otherwise  negative. He underwent capsule endoscopy on June 11, 2007.  This was a  negative study.  It was felt, that in view of the patient's recurrent  symptoms and lack of localized source of bleeding, patient would benefit  from a surgical consultation.  This was kindly provided by Dr. Daphine Deutscher.  Patient is recommended to have a hemicolectomy versus subtotal colectomy  as a definitive procedure.  As patient remained hemodynamically stable,  showed no further evidence of bleeding during the course of his  hospitalization, he was discharged on June 14, 2007, with arrangements  in place to [facility] up with Dr. Daphine Deutscher on an outpatient basis to have  surgery scheduled electively.  Patient is agreeable  with this plan.   PROBLEM #2 -  HYPERTENSION:  Patient's antihypertensives were  discontinued at the time of presentation.  Blood pressure started  creeping up and by June 14, 2007, was 166/90.  Patient's  antihypertensive medication, Zestoretic, which was his pre-admission  medication, has been reinstated accordingly.  He should continue to  follow-up his blood pressure, with his primary M.D.   PROBLEM #3 -  HISTORY OF GASTROESOPHAGEAL REFLUX DISEASE:  Patient  continues on proton pump inhibitor treatment.  There were no symptoms  referable to this, and upper GI endoscopy of June 10, 2007, revealed  hiatal hernia but showed no evidence of esophageal inflammation.   PROBLEM #4 -  SMOKING HISTORY:  Patient has been counseled  appropriately.   PROBLEM #5 -  ACUTE BLOOD LOSS ANEMIA:  Refer to #1 above.  As  mentioned, patient was transfused with 2 units packed red blood cells  resulting in satisfactory bump in hemoglobin to 10.4 as compared to his  pre-admission hemoglobin of 8.7.   DISPOSITION:  Patient was considered sufficiently clinically stable to  be discharged on June 14, 2007.   DIET:  Heart healthy diet.   ACTIVITY:  As tolerated.   FOLLOW UP INSTRUCTIONS:  Patient is to follow up with his primary M.D.,  Dr. Nehemiah Settle, on June 15, 2007, per prior scheduled appointment.  He is to  follow up with Dr. Daphine Deutscher, general surgeon in the coming week, telephone  number [phone].  Patient has been instructed to call for an  appointment.  Also patient is to follow up with Dr. Bosie Clos,  gastroenterologist, in July, telephone number [phone].  All this has  been communicated to the patient.      Isidor Holts, M.D.  Electronically Signed    CO/MEDQ  D:  06/14/2007  T:  06/14/2007  Job:  [id number]   cc:   Deirdre Peer. Polite, M.D.  Shirley Friar, MD  [place] Daphine Deutscher, MD

## 2011-05-06 NOTE — Op Note (Signed)
NAME:  Jeremiah [name], Jeremiah [name] NO.:  [id number]   MEDICAL RECORD NO.:  [id number]          PATIENT TYPE:  INP   LOCATION:  5715                         FACILITY:  [facility]   PHYSICIAN:  Graylin Shiver, M.D.   DATE OF BIRTH:  [birth year]   DATE OF PROCEDURE:  06/10/2007  DATE OF DISCHARGE:                               OPERATIVE REPORT   PROCEDURE:  Esophagogastroduodenoscopy.   INDICATION:  Ongoing gastrointestinal bleeding, rule out upper GI  source.   PREPROCEDURE CONSENT:  Informed consent was obtained after explanation  of the risks of bleeding, infection and perforation.   PREMEDICATIONS:  Fentanyl 50 mcg IV, Versed 7 mg IV.   PROCEDURE:  With the patient in the left lateral decubitus position, the  Pentax gastroscope was inserted into the oropharynx and passed into the  esophagus.  It was advanced down the esophagus, then into the stomach  and into the duodenum.  The second portion and bulb of the duodenum  looked normal.  The stomach revealed a small hiatal hernia, but  otherwise the mucosa in the stomach looked normal.  The esophagus looked  normal.  He tolerated the procedure well without complications.   IMPRESSION:  Small hiatal hernia, otherwise normal upper endoscopy.  There is nothing on this exam to explain gastrointestinal bleeding.   PLAN:  The patient will undergo capsule endoscopy to rule out any  significant abnormalities in the small bowel.   At the present time our feeling is that his ongoing recurrent episodes  of bleeding are most probably of right-sided diverticular origin in the  colon.           ______________________________  Graylin Shiver, M.D.     SFG/MEDQ  D:  06/12/2007  T:  06/12/2007  Job:  [id number]

## 2011-05-06 NOTE — Consult Note (Signed)
Jeremiah [name], Jeremiah [name] NO.:  [id number]   MEDICAL RECORD NO.:  [id number]          PATIENT TYPE:  INP   LOCATION:  0113                         FACILITY:  [facility]   PHYSICIAN:  Shirley Friar, MDDATE OF BIRTH:  September 08, 1941   DATE OF CONSULTATION:  DATE OF DISCHARGE:                                 CONSULTATION   REQUESTING PHYSICIAN:  Dr. Deretha Emory.   PROBLEM:  GI bleed.   HISTORY OF PRESENT ILLNESS:  Jeremiah [name] is a 70 year old black male who is  an unassigned patient being seen due to acute onset of rectal bleeding.  This evening he had acute onset of dark red blood per rectum x3 and 1  episode of bright red blood with associated stool on his last episode;  his last episode was brown with bright red blood.  He denied any  vomiting.  He did have associated dizziness and lightheadedness.  Blood  pressure was 79/43 on arrival with a heart rate of 119.  His hemoglobin  was 11.4.  The patient had a similar episode in 2007, which was thought  to be diverticular in origin.  On colonoscopy in 2007, he had right-  sided diverticulosis without any left-sided diverticulosis.  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  During his last GI bleed, he had a negative blood pool scan  done.  There is a question of whether his bleeding was from an non-  diverticular source, but the exact source could not be identified.  It  was thought to be diverticular in nature.   PAST MEDICAL HISTORY:  History diverticular bleeding, hypertension, and  reported history diverticulitis.   MEDICATIONS:  Zestoretic, Tylenol.   ALLERGIES:  No known drug allergies.   FAMILY HISTORY:  Noncontributory.   SOCIAL HISTORY:  Denies alcohol, positive smoker.   Negative except as stated above.   PHYSICAL EXAM:  VITAL SIGNS:  Temperature 98.1, blood pressure 129/81(on  presentation, blood pressure was 79/43) heart rate 89 and on  presentation, it  was 119.  GENERAL:  Alert, in no acute distress.  CARDIOVASCULAR:  Regular rate and rhythm.  CHEST:  Clear to auscultation anteriorly.  ABDOMEN:  Soft, nontender and nondistended.  Active bowel sounds.  EXTREMITIES:  No edema.   IMPRESSION:  [age]-year-old black male presenting with acute onset  of rectal bleeding which appears be a lower gastrointestinal source.  I  suspect he is having a repeat diverticular bleed based on his  presentation.  His BUN is normal and based on his history, I do not  think this is an upper gastrointestinal source.  He will need a repeat  colonoscopy during this hospitalization and I discussed risks and  benefits of the procedure and he agrees to proceed.  We will do supportive care overnight and plan to prep for colonoscopy on  June 02, 2007 to do the colonoscopy on June 03, 2007.  If his bleeding  persists in the morning of June 02, 2007, he may need a bleeding RBC  scan depending on changes in his hemodynamics.  Shirley Friar, MD  Electronically Signed     VCS/MEDQ  D:  06/01/2007  T:  06/02/2007  Job:  [id number]

## 2011-05-06 NOTE — Consult Note (Signed)
NAME:  ABDULHAMID, OLGIN NO.:  [id number]   MEDICAL RECORD NO.:  [id number]          PATIENT TYPE:  INP   LOCATION:  5715                         FACILITY:  [facility]   PHYSICIAN:  Graylin Shiver, M.D.   DATE OF BIRTH:  Jul 15, 1941   DATE OF CONSULTATION:  06/10/2007  DATE OF DISCHARGE:                                 CONSULTATION   REASON FOR CONSULTATION:  We were asked to see Mr. Grealish today by Dr.  Mikeal Hawthorne of [facility].   HISTORY OF PRESENT ILLNESS:  Mr. Strother is a 70 year old African American  male who was recently in the hospital for rectal bleeding.  He was  admitted on June 10, discharged on June 16.  On June 18, he was  readmitted having maroon stools once again.  He says that when he was  discharged he was eating a full diet within the hospital, but he had not  really had a solid bowel movement since his colonoscopy on June 14.  When he went home, he did have two good meals, one was baked potatoes  and salad.  The very next day, at approximately 2:00 to 3:00 in the  afternoon, he started having maroon bloody bowel movements.  Came to the  ER, had several bloody bowel movements in the ER.  States has not had a  bowel movement since yesterday morning June 18.  Is reporting some  epigastric pain at present and says that he regularly has a large amount  of gas.  On his last hospitalization, he had a colonoscopy that showed  right-sided diverticula, one small nonsignificant polyp in the proximal  descending colon, tiny sigmoid diverticula and small internal and  external hemorrhoids.  No biopsies were taken at that time due to the  recent bleeding.  He also had a nuclear medicine scan on June 04, 2007.  The patient reports that at this time the scan was being done, he was  actively having bloody bowel movements and scan was negative.  He has  not had an upper endoscopy since 2000 when he had an upper endoscopy  with esophageal dilatation Mr. Zaremba was  hospitalized with similar  episodes of rectal bleeding in 2002-2004, twice in 2007, then on June 01, 2007 and now on June 09, 2007.  He says that the episodes this June  have been worse than any previous episodes of bleeding.   PAST MEDICAL HISTORY:  Significant for history of diverticular bleeding,  hypertension and reported history of diverticulitis.   CURRENT MEDICATIONS AT HOME:  1. Zestoretic iron 325 mg.  2. Prilosec 20 mg.  3. Tylenol p.r.n.  4. He is on no anticoagulation medication.   ALLERGIES:  He has no known drug allergies.   FAMILY HISTORY:  Noncontributory.   SOCIAL HISTORY:  He denies alcohol.  He does smoke tobacco.   REVIEW OF SYSTEMS:  Positive only for recurrent bleeding and epigastric  pain as well as left leg pain.   PHYSICAL EXAMINATION:  GENERAL:  He is alert, oriented and in no  apparent distress.  VITAL SIGNS:  Current temperature is 98.2, pulse 74, respirations 18,  blood pressure 139/80.  HEART:  Regular rate and rhythm with no murmurs, rubs or gallops  appreciated.  LUNGS:  Clear to auscultation bilaterally.  ABDOMEN:  Soft, nontender, nondistended with increased bowel sounds.   LABORATORY DATA:  Current labs show a hemoglobin now of 9.8, hematocrit  29.2, white count 7.9, platelets 251.  On admission yesterday,  hemoglobin was 8.7, PT was 13.1, PTT 29.  CMP:  Sodium 138, potassium  3.3, chloride 108, bicarb 25, glucose 136, BUN 9, creatinine 1.15.  LFTs  were normal.  Serum albumin is low at 2.7.  On admission, the patient  was typed and screened.  He has received 2 units of blood, so after 2  units, his hemoglobin has only increased by 1.1 mg.   ASSESSMENT:  GI bleed, unsure of source.  Dr. Wandalee Ferdinand has seen and  examined the patient, collected a history.  He will begin his evaluation  with an upper endoscopy this morning.  If that is negative, he will do a  capsule endoscopy before recommending surgery.      Stephani Police, PA     ______________________________  Graylin Shiver, M.D.    MLY/MEDQ  D:  06/10/2007  T:  06/10/2007  Job:  [id number]   cc:   Lonia Blood, M.D.  Graylin Shiver, M.D.  Petra Kuba, M.D.

## 2011-05-06 NOTE — Discharge Summary (Signed)
NAMEOLEG, OLESON NO.:  [id number]   MEDICAL RECORD NO.:  [id number]          PATIENT TYPE:  INP   LOCATION:  5712                         FACILITY:  [facility]   PHYSICIAN:  Gabrielle Dare. Janee Morn, M.D.DATE OF BIRTH:  [birth year]   DATE OF ADMISSION:  08/20/2007  DATE OF DISCHARGE:  08/25/2007                               DISCHARGE SUMMARY   DISCHARGE DIAGNOSES:  1. Right-sided colon diverticular hemorrhage.  2. Status post right colectomy   HISTORY OF PRESENT ILLNESS:  Mr. 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  He presented for elective colectomy.   HOSPITAL COURSE:  Mr. Seres underwent an uncomplicated right colectomy.  Postoperatively, he remained afebrile and hemodynamically stable.  His  blood pressure remained well controlled on metoprolol and lisinopril  with an occasional extra dose of IV metoprolol.  He had some mild  wheezing.  It was controlled well with  Combivent and respiratory  therapy.  His postoperative ileus gradually resolved, allowing  advancement of his diet starting on postoperative day 3.  He moved his  bowels and was advanced to a regular diet on postoperative day 4.  He  tolerated oral pain medication and is discharged home in stable  condition on postoperative day 5.  The patient's pathology report  revealed multiple right-sided colon diverticula with inflammation and  some mild active hemorrhage.   DISCHARGE DIET:  Regular.  I feel it is safe for him to eat corn as he  likes it very much.  The latest data that supports no correlation with  corn consumption and diverticular disease.  Followup will be with myself  in 1 week.   DISCHARGE MEDICATIONS:  Percocet 5/325 one to two p.o. q.6 hours p.r.n.  pain.   He is also to continue home medications including:  1. Lisinopril/HCTZ 20/25 one p.o. q.a.m.  2. Metoprolol 100 mg p.o. q.a.m.  3. Prilosec  20 mg as needed      Laurell Josephs E. Janee Morn, M.D.  Electronically Signed     BET/MEDQ  D:  08/25/2007  T:  08/25/2007  Job:  [id number]   cc:   Deirdre Peer. Polite, M.D.  Graylin Shiver, M.D.

## 2011-05-09 NOTE — Consult Note (Signed)
NAMEHARUTYUN, MONTEVERDE NO.:  [id number]   MEDICAL RECORD NO.:  [id number]          PATIENT TYPE:  INP   LOCATION:  1604                         FACILITY:  [facility]   PHYSICIAN:  Anselm Pancoast. Weatherly, M.D.DATE OF BIRTH:  Apr 29, 1941   DATE OF CONSULTATION:  03/24/2006  DATE OF DISCHARGE:  03/24/2006                                   CONSULTATION   CHIEF COMPLAINT:  GI bleeding presumably colonic.   HISTORY:  Jeremiah [name] is a 70 year old black male who has mild  hypertension who was just recently admitted for GI bleeding by Dr. Arlyce Dice  and gives a long history of intermittent problems with GI bleeding, the  source of which has not been definitely determined. He states he bled in  2003 and 2004 and it is presumably thought to be colon but has never  definitely a source of the bleeding determined. He was hospitalized this  past week here at [facility] and was evaluated and received 2 units of  blood. The bleeding appeared to stop. I am not sure exactly what test he had  and then was released, but only about 24 hours later he started having frank  bleeding per rectum again. He describes this as dark with clots, not pure  fresh blood but it is certainly described as more colonic bleeding than  duodenal/peptic ulcer type bleeding. He had been admitted by the [facility]  and was discharged but then returned to the emergency room actively bleeding  by ambulance on the 30th and was admitted by Dr. Leone Payor with instructions  to be followed up by Dr. Arlyce Dice the following day. His hemoglobin was 10.1  and at discharge it was 10.9. He was admitted by Dr. Leone Payor, started on  serial hemoglobin and hematocrits and then the next day Dr. Arlyce Dice called me  to see the patient at which time when I saw him on the 30th, he was having a  blood pool scan done in nuclear medicine but the blood pool scan was not  showing any bleeding activity. His white count was 12,400, his electrolytes  were normal and over the next 12 hours with IV fluids there was a  significant drop in his hemoglobin and hematocrit but there was no further  bleeding per rectum. His hematocrit dropped down to 22 and he was transfused  2 units of packed cells by Dr. Virginia Rochester who was actually on call and this brought  his hemoglobin back up to 10.3 and a hematocrit of 30.4. Over the next  basically 48 hours, he was on a clear liquid diet and was not having any  change in his hemoglobin and hematocrit. His high blood pressure which was  not being treated with medication did become elevated again and then after 2  days of clear liquids underwent a colonoscopy yesterday by Dr. Leone Payor and  from the reports I do not think that he found as much diverticulosis, if  any, as had been anticipated from the previous descriptions as this  presumably had been a diverticular bleeding. There was a little polyp  removed and post colonoscopy  he has been started on a diet and his blood  count today is back up to a hemoglobin of approximately 13. I certainly do  not think that we have identified the source of bleeding and if another  acute bleeding episode would occur, I would recommend that he have a repeat  emergency blood pool scan and I think that we missed the opportunity when he  was in the emergency room and it was approximately midnight that if a blood  pool scan had been done immediately upon his arriving at the emergency room  we probably would have been able to identify a source as in when he bleeds  it is massive. The patient is basically asymptomatic, has been evaluated  previously with a colonoscopy 2 years ago. As far as now, I would not worry  about doing an endoscopy capsule or an arteriogram but the patient is aware  that if he does have acute rebleeding that an emergency blood pool scan  needs to be the first and prompt examination. Each of his previous episodes  have been about 2 units of blood lost and his  pressure will kind of drop and  he stops bleeding has been the pattern now over a period of about 3 years  all total. He is on antihypertensive medications which will of course need  to be continued and I think they have actually got him on an H2 blocker. It  is possible that if he would have an ulcer and a Meckel's diverticulum that  the characteristics of his bleeding would be possibly met if it is massive  as when he bleeds it is not pure fresh blood but it is certainly not the old  digested blood like you see from a duodenal ulcer. I do not think that he  has actually had a repeat upper endoscopy at this admission either. His  chart says he has a history of an esophageal stricture, I do not know the  details of that. As far as his medications, he is on Zestoretic one daily  for blood pressure control and he takes Tylenol for kind of arthritis  symptoms and is not using any type of aspirin products. He is trying to get  his health care through the [place]. Previously he has seen Dr. Concepcion Elk but  states that he really does not have a primary care physician at this time.  He denies alcohol use and just a very light occasional smoker. I do not  think he has had any previous abdominal surgery. He certainly does appear to  be having episodes of colonic bleeding but definitely needs a more definite  localization of the area or source of bleeding before any type of surgical  intervention would be considered.           ______________________________  Anselm Pancoast. Zachery Dakins, M.D.     WJW/MEDQ  D:  03/24/2006  T:  03/24/2006  Job:  [id number]

## 2011-05-09 NOTE — H&P (Signed)
NAMEDESIREE, FLEMING NO.:  [id number]   MEDICAL RECORD NO.:  [id number]          PATIENT TYPE:  INP   LOCATION:  1513                         FACILITY:  [facility]   PHYSICIAN:  Barbette Hair. Arlyce Dice, M.D. [facility] OF BIRTH:  [birth year]   DATE OF ADMISSION:  03/16/2006  DATE OF DISCHARGE:                                HISTORY & PHYSICAL   CHIEF COMPLAINT:  Rectal bleeding.   HISTORY:  Jeremiah [name] is a 70 year old white male with history of diverticular  bleeding x2 in the past, requiring hospitalization in 2003 and 2004.  His  last colonoscopy was in October 2003. At that time had left-sided  diverticulosis and one 3 mm polyp in the transverse colon which was removed.  Pathology was consistent with hyperplastic polyp.  He was also noted to have  right-sided diverticulosis at that time.   The patient has history of hypertension, osteoarthritis, and history of  esophageal stricture which has been asymptomatic.   The patient relates that he has been doing well since 2004 until early this  morning about 7:15 a.m. when he had onset of rectal bleeding.  He describes  this as red blood mixed with stool.  He had one episode at home and then  came to the emergency room.  He has had 4 further episodes,  the last of  which was darker and with less volume.  He denies any abdominal cramping or  pain.  He has had no nausea or vomiting, no lightheadedness, etc.  He was  seen and evaluated in the emergency room and found to be hemodynamically  stable.  Hemoglobin is 14.1.  He is admitted at this time with probable  recurrent diverticular bleed for supportive management.   MEDICATIONS:  1.  Zestoretic 1 p.o. daily.  2.  Tylenol p.r.n.  3.  Allegra 1 p.o. daily.   ALLERGIES:  No known drug allergies.   PAST MEDICAL HISTORY:  Pertinent for:  1.  Hypertension.  2.  Esophageal stricture.  3.  Colon polyps.   FAMILY HISTORY:  Negative for GI disease.  Pertinent for diabetes and  coronary artery disease.   SOCIAL HISTORY:  The patient is retired, married.  No ETOH.  He smokes a few  cigarettes per week.   REVIEW OF SYSTEMS:  CARDIOVASCULAR: Denies any chest pain or anginal  symptoms.  PULMONARY: Negative for cough, shortness of breath, or sputum  production.  GENITOURINARY: Negative for dysuria, hematuria, or urgency.  MUSCULOSKELETAL: Positive for arthritic symptoms.   PHYSICAL EXAMINATION:  GENERAL: Well-developed African-American male in no  acute distress.  VITAL SIGNS: Temperature 96.5, blood pressure 145/108, pulse 100, O2  saturation 98% on room air.  HEENT:  Atraumatic and normocephalic.  EOMI.  PERRLA.  Sclerae anicteric.  CARDIOVASCULAR:  Regular rate and rhythm with S1 and S2.  No murmur, rub, or  gallop.  PULMONARY: Clear to auscultation and percussion.  ABDOMEN: Soft. Bowel sounds active.  He is nontender.  There is no palpable  mass or hepatosplenomegaly.  RECTAL:  Maroon stool per the ER physician, not repeated.  EXTREMITIES:  Without clubbing,  cyanosis, or edema.   IMPRESSION:  5.  A 70 year old African-American male with recurrent diverticular      bleeding, third episode, with documented universal diverticulosis,      currently hemodynamically stable.  2.  History of hypertension.  3.  Osteoarthritis.  4.  History of colon polyps.   PLAN:  The patient is to be admitted to the service of Dr. Melvia Heaps for  IV fluid hydration, serial hemoglobin and hematocrit with transfusion as  needed to keep his hemoglobin 10.  He will be observed.  Should he have  persistent or prolonged bleeding, may need repeat colonoscopy.  Otherwise  hopefully can avoid repeat exam as he had colonoscopy in 2003.      Mike Gip, P.A.-C. [facility]      Robert D. Arlyce Dice, M.D. [facility]  Electronically Signed    AE/MEDQ  D:  03/16/2006  T:  03/17/2006  Job:  [id number]

## 2011-05-09 NOTE — Discharge Summary (Signed)
   NAMEWHITT, AULETTA NO.:  [id number]   MEDICAL RECORD NO.:  [id number]                   PATIENT TYPE:  INP   LOCATION:  0348                                 FACILITY:  [facility]   PHYSICIAN:  Olene Craven, M.D.            DATE OF BIRTH:  29-Jun-1941   DATE OF ADMISSION:  09/20/2002  DATE OF DISCHARGE:  09/23/2002                                 DISCHARGE SUMMARY   DISCHARGE DIAGNOSES:  1. Low gastrointestinal bleed.  2. Diverticular bleed.  3. Hypertension.  4. Tobacco abuse.  5. Esophageal reflux.   DISCHARGE MEDICATIONS:  1. Zestoretic 20/25 1 tablet p.o. q.d.  2. Nexium 40 mg p.o. q.d.   FOLLOW-UP:  He is to follow up in two to three weeks with Dr. Barbee Shropshire.  Follow up with Dr. Arlyce Dice as directed.   CONSULTATIONS:  Dr. Arlyce Dice, [facility].   PROCEDURE:  Colonoscopy, which showed normal cecum and a nonbleeding polyp.   HOSPITAL COURSE:  The patient was admitted on September 20, 2002, after  experiencing a lower GI bleed, bright red blood per rectum.  Denies any  chest pain associated with it.  He had a previous history of diverticular  bleed last year.  He was admitted, and vital signs were stable.  He did not  require a blood transfusion.  Secondary to the GI bleed, Dr. Arlyce Dice was  consulted.  It was elected to do a repeat colonoscopy in lieu of  questionable lipomatous changes in the cecum at last admission.  Colonoscopy  showed a normal cecum at this time and a nonbleeding polyp, which was  removed.  Likely cause of the bleed was determined to be diverticular  disease.  His blood pressure was out of control on admission secondary to  his noncompliance with medication regimen.  He was started back on his  previous home dose, and his blood pressure was improved at the time of  discharge.  He is being discharged in stable condition to follow up as an  outpatient.                                               Olene Craven,  M.D.    DEH/MEDQ  D:  10/11/2002  T:  10/12/2002  Job:  [id number]   cc:   Barbette Hair. Arlyce Dice, M.D. [facility]

## 2011-05-09 NOTE — Discharge Summary (Signed)
Jeremiah [name], MAH NO.:  [id number]   MEDICAL RECORD NO.:  [id number]                   PATIENT TYPE:  INP   LOCATION:  0380                                 FACILITY:  [facility]   PHYSICIAN:  Malcolm T. Russella Dar, M.D. [facility]          DATE OF BIRTH:  Dec 17, 1941   DATE OF ADMISSION:  02/10/2003  DATE OF DISCHARGE:  02/13/2003                                 DISCHARGE SUMMARY   ADMISSION DIAGNOSES:  100. A 70 year old male with acute lower gastrointestinal bleed.  2. History of recurrent diverticular hemorrhage with last admission October     2003.  3. Hypertension.  4. History of esophageal stricture, asymptomatic.   DISCHARGE DIAGNOSES:  1. Stable, status post recurrent diverticular hemorrhage.  2. Anemia requiring transfusion secondary to above.  3. History of hypertension.  4. History of hyperplastic colon polyp.   CONSULTATIONS:  None.   PROCEDURE:  Transfusion x2.   HISTORY OF PRESENT ILLNESS:  The patient is a very nice 70 year old African  American male whose primary care physician is Dr. Kern Reap.  He has a  history of diverticular hemorrhaging on two occasions in the past, once in  2002 and with a recurrent bleed in October of 203.  He did undergo EGD and  colonoscopy in 2002, at that time was found to have sigmoid diverticulosis  and some submucosal hemorrhaging in the region of the cecum.  EGD showed  grade I esophagitis and a distal esophageal stricture which was  asymptomatic.  When he was rehospitalized last fall in October of 2003, he  was recolonoscoped.  At that time, he had left-sided diverticulosis and  normal-appearing cecum.  He had a small polyp in the proximal transverse  colon.  Pathology consistent with hyperplastic polyp.  With his two previous  bleeds, he did not require any transfusions.   At this time he presents with recurrent hematochezia and had had four  episodes prior to coming to the emergency room.  This was  associated with  some lower abdominal cramping but no pain.  He did have a presyncopal  episode and dizziness prior to arrival.  In the emergency room he was found  to be hemodynamically stable but with pulse of 150, blood pressure of  108/79.  He was volume repleted and admitted for stabilization and medical  management of probable recurrent diverticular hemorrhage.   LABORATORY DATA:  On admission, wbc 10.6, hemoglobin 13, hematocrit 38.5,  MCV 97, platelets 223.  Serial values were obtained on February 11, 2003,  with hemoglobin down to 9.7, hematocrit of 29.3.  Later that day, hemoglobin  10.2, hematocrit 30.3 post transfusion.  On February 12, 2003, hemoglobin  10.4, hematocrit 30.6, and on February 13, 2003, hemoglobin 9.8, hematocrit  29.1.  Protime 13.9, INR 1.0, PTT 27.  Electrolytes within normal limits.  Glucose 125 on admission, BUN 14, creatinine 1.1.  Albumin 3.7.  Liver  function studies normal.  Blood type AB positive.   There were no x-ray studies.   The EKG on admission showed a sinus tachycardia, on February 11, 2003,  normal sinus rhythm.   HOSPITAL COURSE:  The patient was admitted to the service of Dr. Melvia Heaps.  He was placed at bedrest, rehydrated.  Serial H&H were obtained.  He was covered empirically with IV Protonix.  Given his history it was felt  that he was most likely having a recurrent diverticular hemorrhage and, as  he had undergone colonoscopy in October, it was felt that this was not  necessary this admission.  He was watched in the intensive care unit  overnight and did have a presyncopal versus syncopal episode in the ICU with  recurrent bleed.  He was momentarily unresponsive and dropped his saturation  to 88, blood pressure remained above 100 systolic, heart rate maintained in  the 80s.  He was transfused two units of packed rbc at that time and after  that episode, did not have any further active bleeding.  On February 11, 2003, he was  stable, had had no further active bleeding and was feeling  better.  He was transferred out of the unit on February 12, 2003, again  remained stable.  He states that he had two bowel movements, both of which  were normal-appearing and brown without any evidence of blood.  By February 13, 2003, he was feeling fairly well, tolerating a regular diet and had had  no further active bleeding for almost 48 hours.  His hemoglobin was stable  at 9.8 and he was allowed discharge to home with instructions to follow up  with Dr. Barbee Shropshire in his office in one week.  He said he was overdo for  follow-up there anyway and we would ask Dr. Barbee Shropshire to check a repeat  hemoglobin.  He will follow up with Dr. Arlyce Dice for GI on a p.r.n. basis.  We  did discuss possibility of a future hemicolectomy given recurrent nature of  his bleeding.  He is not really interested in that at present and thus far,  has only had one bleed requiring transfusion.  However, should this continue  to be a recurrent problem for him, would pursue surgical consultation with  the next admission.   DIET:  Regular.   MEDICATIONS:  1. Resume Zestoretic as previous.  2. Tylenol or Tylenol Sinus p.r.n.  3. He was asked not to use any aspirin or NSAIDs.   CONDITION ON DISCHARGE:  Stable and improved.     Amy Esterwood, P.A.-C. [facility]                Malcolm T. Russella Dar, M.D. [facility]    AE/MEDQ  D:  02/13/2003  T:  02/13/2003  Job:  [id number]   cc:   Olene Craven, M.D.  [address]  [address]  [place]  [place] [address]  Fax: [phone]

## 2011-05-09 NOTE — H&P (Signed)
[name], Jeremiah [name] NO.:  [id number]   MEDICAL RECORD NO.:  [id number]          PATIENT TYPE:  INP   LOCATION:  1604                         FACILITY:  [facility]   PHYSICIAN:  Iva Boop, M.D. LHCDATE OF BIRTH:  December 28, 1940   DATE OF ADMISSION:  03/20/2006  DATE OF DISCHARGE:                                HISTORY & PHYSICAL   CHIEF COMPLAINT:  Recurrent bleeding.   Mr.  Jeremiah [name] is a 70 year old black man with a history of diverticular bleeding  two times earlier in 2003 and 2004.  He was just discharged from the  hospital yesterday after he had had about 36 hours of no bleeding after what  was thought to be a recurrent diverticular bleed.  He had his first bowel  movement about 11:30 p.m. (the first since discharge) and it was bloody,  small amount, and then a large amount came out after that and he had  presyncope.  He initially became sweaty, nauseous, and had presyncope and  was on the floor and had a large amount of blood, and then also had another  bloody bowel movement while he was in the EMS transport.  Upon arriving here  his blood pressure was 112/76 with a pulse of 96.  Supine, similar blood  pressure on repeat at 111/67, pulse 94.  When he was sat up, blood pressure  was 94/55, he felt faint, and his pulse was 118.  He has received a liter-  and-a-half of IV fluids and feels somewhat better at this time.  He has had  not had further bleeding, there is no abdominal pain.  He did vomit once or  twice when he had the presyncopal episode.  There was no blood.  There has  been no melena.  He has passed clots.  His hemoglobin is 10.1, at discharge  it was about 10.9.   PAST MEDICAL HISTORY:  1.  As above.  2.  Hypertension.  3.  Esophageal stricture.  4.  Allergies.   FAMILY HISTORY:  Noncontributory.  No GI diseases.   MEDICATIONS AT HOME:  1.  Zestoretic one daily.  2.  Tylenol p.r.n.  3.  Allegra one p.o. daily.   SOCIAL HISTORY:  He is  retired.  He is trying to get healthcare through the  [facility]  He used to see Dr. Concepcion Elk but does not have a primary care physician  at this time.  There is no alcohol.  He smokes a few cigarettes a week.   REVIEW OF SYSTEMS:  He denies any chest pain at this time.  He has not had  any cough or shortness of breath.  He has some joint pains at times.  As  best I can tell, all other systems are negative at this time except for  those things described above.   PHYSICAL EXAMINATION:  GENERAL:  Reveals a pleasant, middle-aged black man  in no acute distress.  VITAL SIGNS:  As reflected above in my HPI.  His temperature is 97.2.  HEENT:  Eyes anicteric, mouth free of lesions.  NECK:  Supple.  CHEST:  Clear.  HEART:  S1, S2.  No murmurs or gallops.  ABDOMEN:  Soft, nontender, bowel sounds are active.  There is no  organomegaly or mass.  EXTREMITIES:  Show no peripheral edema.  He is alert and oriented x3.  The  skin is warm and dry without rash.   Additional lab data shows white count 17.1, platelet count 231.  Coags are  pending.  CMET shows a potassium of 3.1, glucose 168, creatinine 1.3, BUN 9.  Other labs in the CMET are normal.   ASSESSMENT:  1.  Gastrointestinal bleed.  Lower GI bleed, suspected diverticular.  This      is recurrent at this point.  2.  Presyncope secondary to blood loss.  It also sounds like he had a      vasovagal reaction.  3.  Mild hypokalemia with potassium 3.4.  4.  Mild hyperglycemia, blood sugar 168.  5.  History of hypertension, allergies, and esophageal stricture, and a      hyperplastic colon polyp.   PLAN:  1.  Admit to the service of Dr. Melvia Heaps.  2.  Hydrate aggressively.  3.  He is typed and crossed, or will be, and we will transfuse if his      hemoglobin continues to drop.  We will recheck his hemoglobin      immediately and then every 6 hours.  We will follow up on his      hyperglycemia and potassium levels, though I will supplement his       potassium with oral potassium and IV potassium.  4.  Hold his Zestoretic.  We need to clarify that dose, I do not have the      amount.  5.  Go ahead and prep for possible colposcopy.  That was deferred on the      last admission and that certainly was reasonable, but at this point we      may need to press forward depending on his clinical course.  A bleeding      scan and/or surgical consultation could be required depending upon the      bleeding as well.      Iva Boop, M.D. [facility]  Electronically Signed     CEG/MEDQ  D:  03/20/2006  T:  03/21/2006  Job:  [id number]   cc:   Barbette Hair. Arlyce Dice, M.D. [facility]  [address]  [place]  [place] [address]

## 2011-05-09 NOTE — H&P (Signed)
Jeremiah [name], LOTTER NO.:  [id number]   MEDICAL RECORD NO.:  [id number]                   PATIENT TYPE:  INP   LOCATION:  0380                                 FACILITY:  [facility]   PHYSICIAN:  Barbette Hair. Arlyce Dice, M.D. [facility]          DATE OF BIRTH:  June 24, 1941   DATE OF ADMISSION:  02/10/2003  DATE OF DISCHARGE:                                HISTORY & PHYSICAL   CHIEF COMPLAINT:  Bright red bleeding per rectum.   HISTORY OF PRESENT ILLNESS:  The patient is a pleasant, 70 year old, African  American gentleman.  His primary care physician is Olene Craven, M.D.  He has a history of diverticular bleeds on two occasions, once in 2002 and  again in October of 2003.  He has undergone upper endoscopy with colonoscopy  in 2002.  At that time, there was sigmoid diverticulosis and some submucosal  hemorrhaging in the region of the cecum.  Upper endoscopy revealed grade A  esophagitis and an esophageal stricture with lumen diameter about 1 mm.  The  patient has had no complaints of dysphagia, however, and has not undergone  esophageal dilatation.  When the patient represented in October of 2003, he  was recolonoscoped.  At that time, left-sided diverticulosis was again  encountered and the cecum looked normal.  There was a small polyp which was  hot biopsied in the proximal transverse colon.  Pathology was that of a  hyperplastic polyp.  At no time has the patient required transfusion with  packed red blood cells during either incidence of diverticular bleeding.   The patient presented to February 10, 2002, to the [facility]  Emergency Room with less than a 24-hour history of hematochezia.  By the  time he reached the emergency room, he had about four episodes.  This was  associated with some lower abdominal cramping, but no real abdominal pain.  He had developed presyncope and dizziness the morning that he came to the  emergency room.  However, he  had no syncopal spells.  In the emergency room,  he was tachycardic with a pulse of 152 and blood pressure 108/79.  Barbette Hair.  Arlyce Dice, M.D., evaluated him and admitted him for further care.  The  hemoglobin in the emergency room was 13 and the MCV was normal.  Coagulation  times were normal.  The patient does occasionally use ibuprofen, but no  aspirin.  He is not currently using any proton pump inhibitor therapy,  though  he has been on Prilosec in the past.  He has occasional heartburn,  but this has not been any worse in the last couple of weeks than usual and  the episodes are infrequent.  He denies dysphagia.   PAST MEDICAL HISTORY:  1. Hypertension.  2. Remote history of nephrolithiasis.  3. Colon polyp, hyperplastic.  4. Diverticulosis with a history of diverticular bleeds in 2002 and 2003.  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  6. Asymptomatic esophageal stricture and esophagitis on endoscopy in 2002.   ALLERGIES:  No known drug allergies.   CURRENT MEDICATIONS:  1. Zestoretic one p.o. daily.  2. Occasional Tylenol, about one pill per week.  3. Occasional Allegra.  4. Occasional Sudafed.  5. Vitamin C, dose unknown, one p.o. daily.  6. Viagra p.r.n.  Has not used this recently.   FAMILY HISTORY:  Diabetes mellitus, type 2, and hypertension.  There have  been MIs in two of his brothers.   SOCIAL HISTORY:  The patient is married and lives in [place], [place], with his wife.  He smokes about two packs of cigarettes a week.  He does not consume alcoholic beverages.   REVIEW OF SYSTEMS:  NEUROLOGIC:  Some presyncope prior to arrival in the  emergency room.  No history of stroke.  No headaches.  HEMATOLOGIC:  No  history of unusual bleeding other than these episodes with rectal bleeding.  ENDOCRINE:  No sweats.  No excessive thirst.  GASTROINTESTINAL:  No  significant reflux symptoms.  Again, no dysphagia.  MUSCULOSKELETAL:  He  does have some occasional left shoulder pain and some  back and leg pain.  PULMONARY:  No cough.  No shortness of breath.  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  CARDIOVASCULAR:  No palpitations.  No chest pain.  No extremity edema.  DERMATOLOGIC:  No history of tattoos.  REPRODUCTIVE:  He does have some  erectile dysfunction for which he uses Viagra p.r.n.  GENERAL:  No weight  loss.  Appetite is excellent.  EARS, NOSE, AND THROAT:  No history of nose  bleeds.  Does complain of about a week's worth of rhinorrhea, sinus  congestion, and hoarseness.   PHYSICAL EXAMINATION:  GENERAL APPEARANCE:  The patient is a pleasant,  healthy-appearing, African American male in no distress.  He is a good  historian and is mentating clearly.  VITAL SIGNS:  The blood pressure is 123/88, pulse 108, and respirations 20.  WEIGHT:  170 pounds estimated.  HEENT:  The sclerae are nonicteric.  The conjunctivae are pink.  Extraocular  movements are intact.  Oropharynx:  The mucosa is moist and clear.  Dentition is in good repair.  The patient's voice is hoarse.  NECK:  There is no JVD, masses, thyromegaly, or bruits.  CHEST:  Clear to auscultation and percussion bilaterally.  CORONARY:  There is regular rhythm and tachycardic rate.  No murmurs, rubs,  or gallops.  ABDOMEN:  Soft, nontender, and nondistended.  No hernias, masses,  tenderness, or hepatosplenomegaly.  RECTAL:  Notable for bright red blood on the exam glove.  No masses.  EXTREMITIES:  No cyanosis, clubbing, or edema.  Dorsalis pedis pulses are 3+  bilaterally.  NEUROLOGIC:  Grip is 5/5.  No tremors.  He is alert and oriented x 3.  PSYCHIATRIC:  Affect is normal.  Insight is average.  DERMATOLOGIC:  No obvious bruising.  No tattoos.  No rashes.   LABORATORY DATA:  White blood cell count 10.6, hemoglobin 13, hematocrit  38.5, MCV 97.8, platelets 223,000.  PT 13.9, INR 1.0, PTT 27.  Sodium 140, potassium 3.7, glucose 127, BUN 14, creatinine 1.1.   Calcium 8.7, albumin  3.7.  Total bilirubin, alkaline phosphatase, AST, and ALT are all within  normal limits.  The EKG shows normal sinus rhythm at a rate of 75 beats per  minute.   IMPRESSION:  1. Lower gastrointestinal bleed, almost certainly secondary to recurrent     diverticular bleed with history of diverticular bleeds on two occasions     in the last three years.  2. No evidence for coagulopathy.  3. History of hypertension.  4. History of symptomatic esophageal stricture and esophagitis.   PLAN:  1. The patient was admitted by Barbette Hair. Arlyce Dice, M.D., for supportive care     to the telemetry unit.  Will plan to get serial H&Hs to assess for     development of anemia associated with GI bleed.  2.     Hold blood pressure medications for the time being.  3. No plans for colonoscopy or upper endoscopy in this patient as he has had     fairly thorough work-up in the last couple of years.     Brett Canales, P.A. [facility]                    Robert D. Arlyce Dice, M.D. [facility]    SG/MEDQ  D:  02/12/2003  T:  02/12/2003  Job:  [id number]

## 2011-05-09 NOTE — H&P (Signed)
[facility]  Patient:    Jeremiah [name], Jeremiah [name]                        MRN: [id number] Adm. Date:  [id number] Attending:  Olene Craven                         History and Physical  DATE OF BIRTH:  1941-03-19.  CHIEF COMPLAINT:  Passing blood in the rectum.  HISTORY OF PRESENTING ILLNESS:  This is a 70 year old black male who two weeks ago had a history of passing blood in his stool x 1 episode with no associated symptoms.  Today, he returned from work, noticed slight abdominal discomfort and in using the bathroom, he noted passing what he said was bright red blood per rectum on three separate episodes.  He came to the emergency room and was noted to be slightly hypotensive, 88/50, in department of triage.  He also had another episode of maroon stool while here in the ED.  Patient responded to fluids with a rise in his blood pressure.  He denies any chest pain with any of these episodes.  His abdominal pain is described as crampy, diffuse, now resolved.  He denies use of excessive NSAIDs or alcohol.  REVIEW OF SYSTEMS:  Positive for abdominal pain, positive for bright red blood per rectum.  No chest pain.  No shortness of breath.  No dizziness.  No dysuria.  No neurologic changes.  No new rashes.  No headache.  No fever, chills, nausea, vomiting, diarrhea or constipation.  Ten-point systems review: See ED flow sheet.  PAST MEDICAL HISTORY:  Significant for hypertension.  PAST SURGICAL HISTORY:  None.  SOCIAL HISTORY:  He is married with six kids and is a one-pack-per-day smoker. Drinks two or three drinks a week, one tonight.  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  FAMILY HISTORY:  Positive for coronary artery disease, heart attack, hypertension, diabetes.  No colon cancer in the family.  ALLERGIES:  He has no known drug allergies.  CURRENT MEDICATIONS:  Zestoretic 20/25 one p.o. q.d.  PHYSICAL EXAMINATION:  VITALS:   Blood pressure 176/111, pulse 124, 98.9 and respirations were reported as 40.  Repeat blood pressure in triage was 88/50.  GENERAL:  Awake, oriented male in no apparent distress.  HEENT:  Pupils are equal, reactive to light.  Extraocular muscles were intact. Tympanic membranes within normal.  Oropharynx was clear without any erythema or exudate.  Fundi within normal.  NECK:  Supple neck.  No JVD was noted.  No bruit.  No thyromegaly.  LUNGS:  Clear to auscultation.  HEART:  Regular rate and rhythm without murmur, gallop or rub.  ABDOMEN:  Soft, nontender.  Positive bowel sounds.  Slight lower abdominal tenderness.  No guarding or rigidity.  RECTAL:  Rectal per ED, was gross maroon stool, normal tone, no masses.  EXTREMITIES:  No clubbing, cyanosis, or edema.  NEUROLOGIC:  Nonfocal.  LABORATORY AND X-RAY FINDINGS:  Sodium was 140, potassium of 3.8, chloride 107, bicarb 27, BUN 15, creatinine 1.1, glucose was 164, albumin 3.4, AST 20, ALT 17, alkaline phosphatase 54, total bilirubin 0.1.  WBC was 16.5, hemoglobin was 11.9, platelets 264,000.  PTT was 24, PT 13.1, INR of 1.0.  IMPRESSION AND PLAN:  Mr. Gerrard presents with a lower gastrointestinal bleed. He will be admitted for monitoring of hemoglobin and hematocrit, intravenous fluid support for  his hypotension.  Patient did present slightly hypotensive but responded appropriately to fluid boluses.  He will be admitted to telemetry.  Dr. Judie Petit T. Pleas Koch. with [facility] has been consulted and will see the patient.  We will keep the patient n.p.o. until further studies. He will likely need colonoscopy to assess for possible arteriovenous malformation versus diverticular bleed versus a colonic mass with cancer. Patients other laboratory findings are significant for a leukocytosis which is likely related to his gastrointestinal bleed; however, will add a urinalysis and a chest x-ray to his initial emergency department  workup.  1. Acute gastrointestinal bleed. 2. Hypotension, resolved. 3. Hypertension. 4. Tobacco abuse.  Plan is to admit for intravenous fluids, monitor the hemoglobin and hematocrit and gastroenterology consult. DD:  03/03/01 TD:  03/04/01 Job: [id number]

## 2011-05-09 NOTE — Discharge Summary (Signed)
[facility]  Patient:    Jeremiah [name], Jeremiah [name]                        MRN: [id number] Adm. Date:  [id number] Disc. Date: [id number] Attending:  Olene Craven CC:         Teena Irani. Arlyce Dice, M.D.   Discharge Summary  DATE OF BIRTH:  Jun 15, 1941  DISCHARGE DIAGNOSES: 1. Lower gastrointestinal bleed secondary to diverticulosis. 2. Esophageal stricture. 3. Esophagitis. 4. Hypertension. 5. Acute blood loss anemia.  CONSULTATIONS:  Dr. Arlyce Dice, [facility].  PROCEDURES: 1. Esophagogastroduodenoscopy:  Findings:  Esophagitis and esophageal    stricture. 2. Colonoscopy:  Findings:  Diverticulosis.  Pending final biopsy results.  DISCHARGE MEDICATIONS: 1. Zestoretic 20/25 mg one tablet p.o. q.d. 2. Nexium 40 mg p.o. q.d.  HOSPITAL COURSE:  The patient was admitted on March 03, 2001, after experiencing a lower GI maroon stool episodes x 3, one witnessed in the emergency department.  The patients vital signs were stable.  He did have slight hypotension which responded to fluid boluses.  He was admitted to [facility] on telemetry.  Subsequent H&H dropped as low as 9.2.  The patient did not require any blood transfusion.  Dr. Arlyce Dice was consulted.  EKG and colonoscopy were performed with the findings of esophagitis, esophageal stricture, and diverticulosis.  Bleed was probably the result of acute diverticulosis that that has spontaneously resolved.  The patient was subsequently being discharged with a final hemoglobin stable at 9.9. Hypertensin:  Blood pressure is currently controlled.  Started back on his Zestoretic without complication.  He will be started on Nexium 40 mg p.o. q.d. for his esophagitis.  DISCHARGE FOLLOWUP: 1. The patient is to follow up with Dr. Kern Reap in one weeks time for    CBC and to enroll in his practice. 2. The patient is to follow up with Dr. Arlyce Dice for an outpatient EGD to    address esophageal stricture.  DISCHARGE  STATUS:  The patient is being discharged in stable condition. DD:  03/06/01 TD:  03/07/01 Job: [id number]

## 2011-05-09 NOTE — Discharge Summary (Signed)
NAMEELRIC, TIRADO NO.:  [id number]   MEDICAL RECORD NO.:  [id number]          PATIENT TYPE:  INP   LOCATION:  1604                         FACILITY:  [facility]   PHYSICIAN:  Iva Boop, M.D. LHCDATE OF BIRTH:  [birth year]   DATE OF ADMISSION:  03/20/2006  DATE OF DISCHARGE:  03/24/2006                                 DISCHARGE SUMMARY   ADMITTING DIAGNOSES:  1.  Recurrent lower gastrointestinal bleed, suspect diverticular.  2.  Presyncope secondary to blood loss with probable vasovagal reaction.  3.  Mild hypokalemia.  4.  Mild hyperglycemia.  5.  History of hypertension.  6.  Gastroesophageal reflux disease with esophageal stricture and      hyperplastic colon polyps.  7.  Anemia secondary to #1.   DISCHARGE DIAGNOSES:  1.  Recurrent diverticular bleed, resolved.  2.  Anemia requiring transfusion secondary to above.  3.  Small colon polyps, pathology pending at discharge.  4.  Hypertension.  5.  Mild hypokalemia, correcting.   CONSULTATIONS:  Surgery, Dr. Zachery Dakins.   BRIEF HISTORY:  Othal is a pleasant 70 year old African-American male  with a history of diverticular bleeding with admissions in 2003 and 2004.  He just recently had a diverticular bleed and was admitted March 26 through  March 19, 2006, did require transfusion with that bleed as well.  He was  discharged after approximately 36 hours of no bleeding, and then the evening  prior to admission had a bowel movement about 11:30 p.m. which was small  volume and bloody.  He then had a larger amount of bleeding with secondary  presyncope.  He became diaphoretic, nauseated, and was found on the floor.  The ER transported him.  He did have one other episode of bloody stool  during that time.  In the emergency room his blood pressure was 112/76,  pulse of 96.  He was volume resuscitated.  He was then evaluated by Dr.  Leone Payor, had not had any further episodes of bleeding.  Hemoglobin was 10  on  arrival and he was admitted for supportive management.   LABORATORY STUDIES:  On March 30, WBC of 17.1, hemoglobin 10.1, hematocrit  of 29.7, MCV of 96.  Serial values were done that day.  By the following  morning, hemoglobin was down to 7.5, hematocrit of 22.8.  He was transfused  with hemoglobin up to 10.5 and 31.  On April 1, hemoglobin 10.3, hematocrit  of 30.4.  On April 2, hemoglobin 11.5, hematocrit of 34.5.  On April 3,  hemoglobin 12.8, hematocrit of 38.5.  Platelets 231.  Protime 13.3, INR of  1.0, PTT of 27.  Potassium on admission 3.1.  Followup on April 3 was 3.4.  BUN 9, creatinine 1.3.  Glucose on admission 168.  Followup on April 3 was  115.  Albumin of 3.1.  Liver function studies normal.  X-ray studies:  Bleeding scan done on March 30 was negative.   PROCEDURES:  Colonoscopy per Dr. Leone Payor on March 23, 2006.   BRIEF HISTORY:  Mr. Haberle was readmitted to the GI service for presumed  recurrent diverticular bleeding associated with presyncope.  Hemoglobin was  10 on admission so he was not initially transfused but was volume replaced.  By the following morning his hemoglobin was down to 7.5 and he was  transfused 2 units of packed cells.  He was seen in consultation by Dr.  Zachery Dakins because he had had diverticular bleeding in the past on two  occasions requiring admission, and then a very recent admission just within  the past week - all felt secondary to diverticular hemorrhage.  Dr.  Zachery Dakins recommended followup colonoscopy.  This was completed by Dr.  Leone Payor on April 2.  He was noted to have right-sided diverticulosis.  There  was no left-sided diverticula noted but he did have deep folds.  He had two  diminutive polyps which were biopsied without cautery and removed.  There  was no stigmata of bleeding or recent bleeding noted at the time of the  procedure.  He was observed and on April 3 had had no further evidence of  bleeding, was feeling well with his  hemoglobin quite good up to 13.5 and  hematocrit of 40.8.  He was allowed discharge to home, warned that it was  possible for him to re-bleed as had happened after his last discharge.  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  Diet regular.  He was to follow up with Dr. Arlyce Dice in the office on April 01, 2006, at 8:45 a.m. and to call for any problems or evidence of recurrent  bleeding in the interim.  He also had followup with the [facility] [place] on  May 13, 2006.   MEDICATIONS ON DISCHARGE:  1.  Diltiazem XR 120 mg daily.  2.  Tylenol p.r.n.  3.  Loratadine 10 mg daily.   He was asked to avoid aspirin, Aleve, ibuprofen, etc. and should he have  another bleed in the near future, would need to consider hemicolectomy.      Mike Gip, P.A.-C. [facility]      Iva Boop, M.D. [facility]  Electronically Signed    AE/MEDQ  D:  04/01/2006  T:  04/01/2006  Job:  [id number]   cc:   Anselm Pancoast. Zachery Dakins, M.D.  [address], [address]  [place]  [place] [address]

## 2011-05-09 NOTE — Discharge Summary (Signed)
Jeremiah [name], Jeremiah [name] NO.:  [id number]   MEDICAL RECORD NO.:  [id number]          PATIENT TYPE:  INP   LOCATION:  1513                         FACILITY:  [facility]   PHYSICIAN:  Barbette Hair. Arlyce Dice, M.D. [facility] OF BIRTH:  Aug 22, 1941   DATE OF ADMISSION:  03/16/2006  DATE OF DISCHARGE:  03/19/2006                                 DISCHARGE SUMMARY   ADMITTING DIAGNOSES:  22.  A 70 year old African-American male with recurrent diverticular      bleeding, third episode with documented universal diverticulosis      previously currently hemodynamically stable.  2.  History of hypertension.  3.  Osteoarthritis.  4.  History of colon polyps.   DISCHARGE DIAGNOSES:  1.  Recurrent diverticular hemorrhage, resolved, major, status post      transfusion x2.  2.  Anemia secondary to #1.  3.  History of hypertension.  4.  Osteoarthritis.  5.  History of colon polyps.   CONSULTATIONS:  None.   PROCEDURES:  None.   BRIEF HISTORY:  Jeremiah [name] is a pleasant 70 year old white male with history  of diverticular bleeding x2, requiring hospitalization in 2003 and in 2004.  His last colonoscopy was then done in October 2003 per Dr. Arlyce Dice with left  and right sided diverticulosis and a 3 mm polyp in the transverse colon  which was removed.  Pathology on that polyp consistent with hyperplastic  polyp.  The patient also with history of hypertension, osteoarthritis, and  an esophageal stricture which has been asymptomatic.   The patient says he is been doing well since 2004 until early on the morning  of admission when at about 7:15 a.m., he had onset of rectal bleeding.  He  described this as red blood mixed with stool, had 1 episode at home and then  came to the emergency room.  Since then, he has had 4 more episodes and says  the last of these was darker and with less volume.  He has not had any  abdominal cramping or pain, no nausea or vomiting, lightheadedness,  diaphoresis, etc.   He does feel little bit weak.  He is hemodynamically  stable in the emergency room.  Initial hemoglobin was 14.1.  He is admitted  to the hospital with probable recurrent diverticular bleed for supportive  management.   LABORATORY STUDIES:  On admission, hemoglobin 14.1, hematocrit of 43.2.  Serial H&H's were obtained later that same day, hemoglobin 10.3, hematocrit  of 30 and the following morning on March 27, hemoglobin down to 8.1,  hematocrit of 24.1.  He was transfused 2 units of packed RBCs with  hemoglobin up to 11.3, hematocrit of 32.4.  On March 28, hemoglobin was 11,  hematocrit of 31.9 and on March 29, hemoglobin 10.8, hematocrit of 31.  Pro  time 13.9, INR of 1.1, platelet count was normal at 286 on admission.  Electrolytes within normal limits.  Glucose 161, BUN 12, creatinine 1.3,  albumin 3.5.  Liver function studies normal.  UA negative.   X-RAY STUDIES:  None.   HOSPITAL COURSE:  The patient was admitted to the  service of Dr. Melvia Heaps, who was covering the hospital.  He was initially placed at bedrest  on IV fluids, a clear liquid diet, and serial H&H's.  Unfortunately he did  continue to bleed through the first 24 hours of his admission and then  stopped.  He dropped his hemoglobin from 14.1 down to 8.1 by the following  morning.  He was transfused 2 units of packed RBCs, tolerated this without  difficulty, and his hemoglobin stabilized thereafter.  He did not have any  further active bleeding.  We gradually advanced his diet, continued  observation, and by March 29, he was allowed discharge to home with  instructions to follow up with Dr. Arlyce Dice in the office on April 11 at 8:45  a.m.  We will plan to check his hemoglobin at that time.  He was advised no  strenuous activity or heavy lifting, to avoid aspirin and aspirin products,  resume his Allegra 1 p.o. q.d. and Zestoretic 1 p.o. daily as previous.  He  was advised to return to the emergency room for any  evidence of recurrent  bleeding.      Mike Gip, P.A.-C. [facility]      Robert D. Arlyce Dice, M.D. [facility]  Electronically Signed    AE/MEDQ  D:  03/19/2006  T:  03/20/2006  Job:  [id number]

## 2011-05-09 NOTE — Op Note (Signed)
NAMEJAELIN, FACKLER NO.:  [id number]   MEDICAL RECORD NO.:  [id number]          PATIENT TYPE:  INP   LOCATION:  5715                         FACILITY:  [facility]   PHYSICIAN:  Shirley Friar, MDDATE OF BIRTH:  28-Apr-1941   DATE OF PROCEDURE:  DATE OF DISCHARGE:  06/14/2007                               OPERATIVE REPORT   PROCEDURE:  Capsule endoscopy.   INDICATION:  Obscure GI bleeding.   FINDINGS:  Normal capsule endoscopy.   RECOMMENDATIONS:  1. Consider right hemicolectomy versus subtotal colectomy for      suspected recurrent diverticular bleeding.  2. Do bleeding scan versus angiogram at first sign of recurrent      bleeding.      Shirley Friar, MD  Electronically Signed     VCS/MEDQ  D:  06/19/2007  T:  06/20/2007  Job:  [id number]   cc:   [place] Daphine Deutscher, MD

## 2011-09-29 ENCOUNTER — Other Ambulatory Visit: Payer: Self-pay | Admitting: Gastroenterology

## 2011-10-03 LAB — COMPREHENSIVE METABOLIC PANEL
ALT: 15
AST: 18
Albumin: 3.9
Alkaline Phosphatase: 65
Chloride: 98
GFR calc Af Amer: >60
Potassium: 3.4 — ABNORMAL LOW
Sodium: 136
Total Bilirubin: 0.6

## 2011-10-03 LAB — CBC
HCT: 36.5 — ABNORMAL LOW
HCT: 40.5
Hemoglobin: 12.1 — ABNORMAL LOW
Hemoglobin: 13.5
MCHC: 33.3
MCHC: 33.3
MCV: 88.6
MCV: 87.5
Platelets: 223
Platelets: 237
Platelets: 284
RBC: 4.12 — ABNORMAL LOW
RBC: 4.63
RDW: 15.6 — ABNORMAL HIGH
RDW: 15.4 — ABNORMAL HIGH
RDW: 15.6 — ABNORMAL HIGH
WBC: 10.2
WBC: 13.2 — ABNORMAL HIGH
WBC: 9.7

## 2011-10-03 LAB — BASIC METABOLIC PANEL
BUN: 9
Calcium: 8.2 — ABNORMAL LOW
Creatinine, Ser: 1.02
GFR calc non Af Amer: >60
Glucose, Bld: 197 — ABNORMAL HIGH

## 2011-10-03 LAB — BASIC METABOLIC PANEL WITH GFR
BUN: 5 — ABNORMAL LOW
CO2: 28
Calcium: 8.6
Chloride: 102
Creatinine, Ser: 0.72
GFR calc non Af Amer: >60
Glucose, Bld: 126 — ABNORMAL HIGH
Potassium: 3.5
Sodium: 137

## 2011-10-08 LAB — CBC
HCT: 23.9 — ABNORMAL LOW
HCT: 26.1 — ABNORMAL LOW
HCT: 26.5 — ABNORMAL LOW
HCT: 27.4 — ABNORMAL LOW
HCT: 28.4 — ABNORMAL LOW
HCT: 29.2 — ABNORMAL LOW
HCT: 29.2 — ABNORMAL LOW
HCT: 30.2 — ABNORMAL LOW
HCT: 33.7 — ABNORMAL LOW
HCT: 34.5 — ABNORMAL LOW
Hemoglobin: 10 — ABNORMAL LOW
Hemoglobin: 10.7 — ABNORMAL LOW
Hemoglobin: 11.1 — ABNORMAL LOW
Hemoglobin: 8 — ABNORMAL LOW
Hemoglobin: 8.7 — ABNORMAL LOW
Hemoglobin: 9.3 — ABNORMAL LOW
Hemoglobin: 9.7 — ABNORMAL LOW
Hemoglobin: 9.8 — ABNORMAL LOW
MCHC: 33.1
MCHC: 33.3
MCHC: 33.4
MCHC: 33.5
MCHC: 33.7
MCHC: 33.7
MCHC: 33.7
MCHC: 33.8
MCV: 90.5
MCV: 90.9
MCV: 90.9
MCV: 91.1
MCV: 91.5
MCV: 92.1
MCV: 92.6
MCV: 93.5
MCV: 94.6
Platelets: 138 — ABNORMAL LOW
Platelets: 189
Platelets: 215
Platelets: 243
Platelets: 246
Platelets: 251
Platelets: 257
Platelets: 262
Platelets: 264
Platelets: 275
Platelets: 305
Platelets: 340
RBC: 2.58 — ABNORMAL LOW
RBC: 2.81 — ABNORMAL LOW
RBC: 3.53 — ABNORMAL LOW
RBC: 3.6 — ABNORMAL LOW
RBC: 3.62 — ABNORMAL LOW
RDW: 14.5 — ABNORMAL HIGH
RDW: 14.7 — ABNORMAL HIGH
RDW: 15.1 — ABNORMAL HIGH
RDW: 15.1 — ABNORMAL HIGH
RDW: 15.1 — ABNORMAL HIGH
RDW: 15.2 — ABNORMAL HIGH
RDW: 15.3 — ABNORMAL HIGH
RDW: 15.3 — ABNORMAL HIGH
RDW: 15.3 — ABNORMAL HIGH
RDW: 15.5 — ABNORMAL HIGH
RDW: 15.7 — ABNORMAL HIGH
WBC: 7.5
WBC: 7.5
WBC: 7.7
WBC: 7.9
WBC: 8.5
WBC: 8.6
WBC: 8.6
WBC: 8.7
WBC: 8.8
WBC: 9.9

## 2011-10-08 LAB — TYPE AND SCREEN

## 2011-10-08 LAB — DIFFERENTIAL
Basophils Absolute: 0
Basophils Relative: 0
Eosinophils Absolute: 0.2
Monocytes Absolute: 0.6
Monocytes Relative: 7
Neutrophils Relative %: 68

## 2011-10-08 LAB — COMPREHENSIVE METABOLIC PANEL
ALT: 15
ALT: 16
Albumin: 2.4 — ABNORMAL LOW
Alkaline Phosphatase: 34 — ABNORMAL LOW
Alkaline Phosphatase: 45
BUN: 3 — ABNORMAL LOW
CO2: 25
Chloride: 108
Chloride: 112
Glucose, Bld: 136 — ABNORMAL HIGH
Glucose, Bld: 94
Potassium: 3.3 — ABNORMAL LOW
Potassium: 3.8
Sodium: 137
Sodium: 138
Total Bilirubin: 0.4
Total Bilirubin: 0.6
Total Protein: 4.4 — ABNORMAL LOW
Total Protein: 5 — ABNORMAL LOW

## 2011-10-08 LAB — BASIC METABOLIC PANEL
BUN: 4 — ABNORMAL LOW
BUN: 5 — ABNORMAL LOW
Chloride: 107
Chloride: 109
Creatinine, Ser: 0.99
GFR calc non Af Amer: >60
Glucose, Bld: 103 — ABNORMAL HIGH
Glucose, Bld: 112 — ABNORMAL HIGH
Potassium: 3.8
Potassium: 4
Potassium: 4.5
Sodium: 140

## 2011-10-08 LAB — HEMOGLOBIN AND HEMATOCRIT, BLOOD
HCT: 27.2 — ABNORMAL LOW
HCT: 31 — ABNORMAL LOW
HCT: 33.1 — ABNORMAL LOW
Hemoglobin: 11 — ABNORMAL LOW

## 2011-10-08 LAB — PROTIME-INR: INR: 1

## 2011-10-09 LAB — CROSSMATCH
ABO/RH(D): AB POS
Antibody Screen: NEGATIVE

## 2011-10-09 LAB — CBC
HCT: 25.8 — ABNORMAL LOW
HCT: 26 — ABNORMAL LOW
HCT: 31.6 — ABNORMAL LOW
HCT: 33.6 — ABNORMAL LOW
Hemoglobin: 8.4 — ABNORMAL LOW
Hemoglobin: 8.7 — ABNORMAL LOW
Hemoglobin: 8.9 — ABNORMAL LOW
Hemoglobin: 9 — ABNORMAL LOW
Hemoglobin: 11.4 — ABNORMAL LOW
MCHC: 33.2
MCHC: 34.5
MCHC: 33.5
MCHC: 35.3
MCV: 92.2
MCV: 93
MCV: 93.6
MCV: 93.9
MCV: 94.1
MCV: 96
Platelets: 121 — ABNORMAL LOW
Platelets: 169
Platelets: 184
Platelets: 194
Platelets: 204
Platelets: 212
Platelets: 220
RBC: 2.72 — ABNORMAL LOW
RBC: 3.04 — ABNORMAL LOW
RBC: 3.17 — ABNORMAL LOW
RBC: 3.38 — ABNORMAL LOW
RBC: 3.39 — ABNORMAL LOW
RDW: 13.3
RDW: 14.2 — ABNORMAL HIGH
RDW: 14.3 — ABNORMAL HIGH
RDW: 15.1 — ABNORMAL HIGH
RDW: 13.2
WBC: 11.9 — ABNORMAL HIGH
WBC: 12.4 — ABNORMAL HIGH
WBC: 7.5
WBC: 8.8
WBC: 9
WBC: 9.9
WBC: 11.6 — ABNORMAL HIGH

## 2011-10-09 LAB — COMPREHENSIVE METABOLIC PANEL
ALT: 14
AST: 15
Alkaline Phosphatase: 37 — ABNORMAL LOW
CO2: 27
Calcium: 7.9 — ABNORMAL LOW
Chloride: 104
GFR calc Af Amer: >60
GFR calc non Af Amer: >60
Glucose, Bld: 171 — ABNORMAL HIGH
Potassium: 3.4 — ABNORMAL LOW
Sodium: 137
Total Bilirubin: 0.5

## 2011-10-09 LAB — PREPARE RBC (CROSSMATCH)

## 2011-10-09 LAB — DIFFERENTIAL
Basophils Absolute: 0
Basophils Absolute: 0.1
Basophils Relative: 0
Basophils Relative: 1
Eosinophils Absolute: 0.2
Eosinophils Absolute: 0
Eosinophils Relative: 0
Lymphocytes Relative: 23
Lymphocytes Relative: 26
Monocytes Absolute: 0.8 — ABNORMAL HIGH
Monocytes Relative: 7
Neutrophils Relative %: 57
Neutrophils Relative %: 66

## 2011-10-09 LAB — URINALYSIS, ROUTINE W REFLEX MICROSCOPIC
Hgb urine dipstick: NEGATIVE
Ketones, ur: 40 — AB
Protein, ur: NEGATIVE
Urobilinogen, UA: 0.2

## 2011-10-09 LAB — BASIC METABOLIC PANEL
BUN: 5 — ABNORMAL LOW
BUN: 7
CO2: 23
CO2: 25
Calcium: 7.1 — ABNORMAL LOW
Calcium: 7.5 — ABNORMAL LOW
Chloride: 111
Creatinine, Ser: 0.97
Creatinine, Ser: 1.06
GFR calc Af Amer: >60
GFR calc Af Amer: >60
GFR calc non Af Amer: >60
Glucose, Bld: 159 — ABNORMAL HIGH
Glucose, Bld: 177 — ABNORMAL HIGH
Sodium: 138

## 2011-10-09 LAB — HEMOGLOBIN AND HEMATOCRIT, BLOOD
HCT: 22 — ABNORMAL LOW
HCT: 28.6 — ABNORMAL LOW
HCT: 29 — ABNORMAL LOW
Hemoglobin: 9.7 — ABNORMAL LOW

## 2011-10-09 LAB — PROTIME-INR
INR: 1
Prothrombin Time: 14.5

## 2011-10-09 LAB — HEMOGLOBIN A1C: Hgb A1c MFr Bld: 5.5

## 2014-01-01 ENCOUNTER — Emergency Department (HOSPITAL_COMMUNITY)
Admission: EM | Admit: 2014-01-01 | Discharge: 2014-01-01 | Discharge status: Against Medical Advice | Payer: Medicare HMO

## 2014-01-01 NOTE — ED Notes (Signed)
Pt called third time by EMT with no answer

## 2014-01-01 NOTE — ED Notes (Signed)
Pt called with no answer

## 2014-06-13 ENCOUNTER — Encounter: Payer: Self-pay | Admitting: Gastroenterology

## 2015-02-22 ENCOUNTER — Other Ambulatory Visit: Payer: Self-pay | Admitting: Internal Medicine

## 2015-02-22 ENCOUNTER — Ambulatory Visit
Admission: RE | Admit: 2015-02-22 | Discharge: 2015-02-22 | Disposition: A | Discharge status: Home / Self Care | Payer: Commercial Managed Care - HMO | Source: Ambulatory Visit | Attending: Internal Medicine | Admitting: Internal Medicine

## 2015-02-22 DIAGNOSIS — S3992XA Unspecified injury of lower back, initial encounter: Secondary | ICD-10-CM | POA: Diagnosis not present

## 2015-02-22 DIAGNOSIS — R7309 Other abnormal glucose: Secondary | ICD-10-CM | POA: Diagnosis not present

## 2015-02-22 DIAGNOSIS — E785 Hyperlipidemia, unspecified: Secondary | ICD-10-CM | POA: Diagnosis not present

## 2015-02-22 DIAGNOSIS — Z1389 Encounter for screening for other disorder: Secondary | ICD-10-CM | POA: Diagnosis not present

## 2015-02-22 DIAGNOSIS — R195 Other fecal abnormalities: Secondary | ICD-10-CM | POA: Diagnosis not present

## 2015-02-22 DIAGNOSIS — I10 Essential (primary) hypertension: Secondary | ICD-10-CM | POA: Diagnosis not present

## 2015-02-22 DIAGNOSIS — F17210 Nicotine dependence, cigarettes, uncomplicated: Secondary | ICD-10-CM | POA: Diagnosis not present

## 2015-02-22 DIAGNOSIS — Z0001 Encounter for general adult medical examination with abnormal findings: Secondary | ICD-10-CM | POA: Diagnosis not present

## 2015-02-22 DIAGNOSIS — D649 Anemia, unspecified: Secondary | ICD-10-CM | POA: Diagnosis not present

## 2015-02-22 DIAGNOSIS — M545 Low back pain: Secondary | ICD-10-CM

## 2015-02-22 DIAGNOSIS — S79912A Unspecified injury of left hip, initial encounter: Secondary | ICD-10-CM | POA: Diagnosis not present

## 2015-02-22 DIAGNOSIS — Z125 Encounter for screening for malignant neoplasm of prostate: Secondary | ICD-10-CM | POA: Diagnosis not present

## 2015-02-22 DIAGNOSIS — M25552 Pain in left hip: Secondary | ICD-10-CM | POA: Diagnosis not present

## 2016-01-28 ENCOUNTER — Other Ambulatory Visit (HOSPITAL_COMMUNITY): Payer: Self-pay | Admitting: Otolaryngology

## 2016-02-04 ENCOUNTER — Other Ambulatory Visit: Payer: Self-pay

## 2016-02-04 ENCOUNTER — Ambulatory Visit (HOSPITAL_COMMUNITY)
Admission: RE | Admit: 2016-02-04 | Discharge: 2016-02-04 | Disposition: A | Discharge status: Home / Self Care | Payer: Commercial Managed Care - HMO | Source: Ambulatory Visit | Attending: Anesthesiology | Admitting: Anesthesiology

## 2016-02-04 ENCOUNTER — Encounter (HOSPITAL_COMMUNITY): Payer: Self-pay

## 2016-02-04 ENCOUNTER — Encounter (HOSPITAL_COMMUNITY)
Admission: RE | Admit: 2016-02-04 | Discharge: 2016-02-04 | Disposition: A | Discharge status: Home / Self Care | Payer: Commercial Managed Care - HMO | Source: Ambulatory Visit | Attending: Otolaryngology | Admitting: Otolaryngology

## 2016-02-04 DIAGNOSIS — K219 Gastro-esophageal reflux disease without esophagitis: Secondary | ICD-10-CM | POA: Insufficient documentation

## 2016-02-04 DIAGNOSIS — I10 Essential (primary) hypertension: Secondary | ICD-10-CM | POA: Diagnosis not present

## 2016-02-04 DIAGNOSIS — F17200 Nicotine dependence, unspecified, uncomplicated: Secondary | ICD-10-CM | POA: Insufficient documentation

## 2016-02-04 DIAGNOSIS — J988 Other specified respiratory disorders: Secondary | ICD-10-CM | POA: Diagnosis not present

## 2016-02-04 DIAGNOSIS — J22 Unspecified acute lower respiratory infection: Secondary | ICD-10-CM

## 2016-02-04 HISTORY — DX: Gastro-esophageal reflux disease without esophagitis: K21.9

## 2016-02-04 HISTORY — DX: Anxiety disorder, unspecified: F41.9

## 2016-02-04 HISTORY — DX: Frequency of micturition: R35.0

## 2016-02-04 HISTORY — DX: Personal history of colon polyps, unspecified: Z86.0100

## 2016-02-04 HISTORY — DX: Unspecified glaucoma: H40.9

## 2016-02-04 HISTORY — DX: Unspecified osteoarthritis, unspecified site: M19.90

## 2016-02-04 HISTORY — DX: Sleep apnea, unspecified: G47.30

## 2016-02-04 HISTORY — DX: Gastrointestinal hemorrhage, unspecified: K92.2

## 2016-02-04 HISTORY — DX: Hyperlipidemia, unspecified: E78.5

## 2016-02-04 HISTORY — DX: Depression, unspecified: F32.A

## 2016-02-04 HISTORY — DX: Major depressive disorder, single episode, unspecified: F32.9

## 2016-02-04 HISTORY — DX: Insomnia, unspecified: G47.00

## 2016-02-04 HISTORY — DX: Personal history of other medical treatment: Z92.89

## 2016-02-04 HISTORY — DX: Post-traumatic stress disorder, unspecified: F43.10

## 2016-02-04 HISTORY — DX: Other diseases of salivary glands: K11.8

## 2016-02-04 HISTORY — DX: Diverticulosis of intestine, part unspecified, without perforation or abscess without bleeding: K57.90

## 2016-02-04 HISTORY — DX: Dorsalgia, unspecified: M54.9

## 2016-02-04 HISTORY — DX: Essential (primary) hypertension: I10

## 2016-02-04 HISTORY — DX: Personal history of colonic polyps: Z86.010

## 2016-02-04 LAB — BASIC METABOLIC PANEL
ANION GAP: 12 (ref 5–15)
BUN: 10 mg/dL (ref 6–20)
CALCIUM: 9.3 mg/dL (ref 8.9–10.3)
CHLORIDE: 97 mmol/L — AB (ref 101–111)
CO2: 27 mmol/L (ref 22–32)
CREATININE: 1.04 mg/dL (ref 0.61–1.24)
GFR calc Af Amer: >60 mL/min (ref >60)
GFR calc non Af Amer: >60 mL/min (ref >60)
Glucose, Bld: 172 mg/dL — ABNORMAL HIGH (ref 65–99)
POTASSIUM: 3.3 mmol/L — AB (ref 3.5–5.1)
SODIUM: 136 mmol/L (ref 135–145)

## 2016-02-04 LAB — CBC
HCT: 38.8 % — ABNORMAL LOW (ref 39.0–52.0)
Hemoglobin: 13.1 g/dL (ref 13.0–17.0)
MCH: 32.1 pg (ref 26.0–34.0)
MCHC: 33.8 g/dL (ref 30.0–36.0)
MCV: 95.1 fL (ref 78.0–100.0)
PLATELETS: 232 10*3/uL (ref 150–400)
RBC: 4.08 MIL/uL — AB (ref 4.22–5.81)
RDW: 13 % (ref 11.5–15.5)
WBC: 9 10*3/uL (ref 4.0–10.5)

## 2016-02-04 NOTE — Progress Notes (Signed)
Sleep study done about 2 months ago-to request from [place] in [place]

## 2016-02-04 NOTE — Progress Notes (Signed)
Cardiologist denies  Medical Md is Dr.Ronald Polite  Echo denies  Stress test denies  Heart cath denies  EKG denies in past yr  CXR denies in past yr

## 2016-02-04 NOTE — Pre-Procedure Instructions (Signed)
QUI SIT  02/04/2016     No Pharmacies Listed   Your procedure is scheduled on Mon, Feb 20 @ 7:15 AM  Report to [facility] Admitting at 5:30 AM  Call this number if you have problems the morning of surgery:  [phone]   Remember:  Do not eat food or drink liquids after midnight.  Take these medicines the morning of surgery with A SIP OF WATER Metoprolol(Lopressor) and Celexa(Citalopram)             No Goody's,BC's,Aleve,Aspirin,Ibuprofen,Motrin,Advil,Fish Oil,or any Herbal Medications.    Do not wear jewelry.  Do not wear lotions, powders, or colognes.  You may wear deodorant.  Men may shave face and neck.  Do not bring valuables to the hospital.  [facility] is not responsible for any belongings or valuables.  Contacts, dentures or bridgework may not be worn into surgery.  Leave your suitcase in the car.  After surgery it may be brought to your room.  For patients admitted to the hospital, discharge time will be determined by your treatment team.  Patients discharged the day of surgery will not be allowed to drive home.    Special instructions:  [place] - Preparing for Surgery  Before surgery, you can play an important role.  Because skin is not sterile, your skin needs to be as free of germs as possible.  You can reduce the number of germs on you skin by washing with CHG (chlorahexidine gluconate) soap before surgery.  CHG is an antiseptic cleaner which kills germs and bonds with the skin to continue killing germs even after washing.  Please DO NOT use if you have an allergy to CHG or antibacterial soaps.  If your skin becomes reddened/irritated stop using the CHG and inform your nurse when you arrive at Short Stay.  Do not shave (including legs and underarms) for at least 48 hours prior to the first CHG shower.  You may shave your face.  Please follow these instructions carefully:   1.  Shower with CHG Soap the night before surgery and the                                 morning of Surgery.  2.  If you choose to wash your hair, wash your hair first as usual with your       normal shampoo.  3.  After you shampoo, rinse your hair and body thoroughly to remove the                      Shampoo.  4.  Use CHG as you would any other liquid soap.  You can apply chg directly       to the skin and wash gently with scrungie or a clean washcloth.  5.  Apply the CHG Soap to your body ONLY FROM THE NECK DOWN.        Do not use on open wounds or open sores.  Avoid contact with your eyes,       ears, mouth and genitals (private parts).  Wash genitals (private parts)       with your normal soap.  6.  Wash thoroughly, paying special attention to the area where your surgery        will be performed.  7.  Thoroughly rinse your body with warm water from the neck down.  8.  DO NOT shower/wash with your normal soap after using and rinsing off       the CHG Soap.  9.  Pat yourself dry with a clean towel.            10.  Wear clean pajamas.            11.  Place clean sheets on your bed the night of your first shower and do not        sleep with pets.  Day of Surgery  Do not apply any lotions/deoderants the morning of surgery.  Please wear clean clothes to the hospital/surgery center.    Please read over the following fact sheets that you were given. Pain Booklet, Coughing and Deep Breathing and Surgical Site Infection Prevention

## 2016-02-05 NOTE — Progress Notes (Signed)
Anesthesia Chart Review:  Pt is a 75 year old male scheduled for L parotidectomy on 02/11/2016 with Dr. Redmond Baseman.  PMH includes:  HTN, hyperlipidemia, OSA (on CPAP), glaucoma, PTSD, GERD. Current smoker. BMI 29.   Medications include: lipitor, lisinopril-hctz, metoprolol, potassium.   Preoperative labs reviewed.    Chest x-ray 02/04/16 reviewed. L nipple shadow. No acute abnormalities.   EKG 02/04/16: Sinus bradycardia (55 bpm) with 1st degree A-V block with PACs. Cannot rule out Anterior infarct, age undetermined. Nonspecific T wave abnormality, new since 06/01/07 per Dr. Irven Shelling interpretation.   If no changes, I anticipate pt can proceed with surgery as scheduled.   Willeen Cass, FNP-BC [facility]/Anesthesiology Phone: [phone] 02/05/2016 4:12 PM

## 2016-02-10 ENCOUNTER — Emergency Department (HOSPITAL_COMMUNITY): Admission: EM | Admit: 2016-02-10 | Payer: Commercial Managed Care - HMO | Source: Home / Self Care

## 2016-02-11 ENCOUNTER — Ambulatory Visit (HOSPITAL_COMMUNITY): Payer: Commercial Managed Care - HMO | Admitting: Certified Registered Nurse Anesthetist

## 2016-02-11 ENCOUNTER — Encounter (HOSPITAL_COMMUNITY): Payer: Self-pay | Admitting: Certified Registered Nurse Anesthetist

## 2016-02-11 ENCOUNTER — Observation Stay (HOSPITAL_COMMUNITY)
Admission: RE | Admit: 2016-02-11 | Discharge: 2016-02-12 | Disposition: A | Discharge status: Home / Self Care | Payer: Commercial Managed Care - HMO | Source: Ambulatory Visit | Attending: Otolaryngology | Admitting: Otolaryngology

## 2016-02-11 ENCOUNTER — Encounter (HOSPITAL_COMMUNITY)
Admission: RE | Disposition: A | Discharge status: Home / Self Care | Payer: Self-pay | Source: Ambulatory Visit | Attending: Otolaryngology

## 2016-02-11 ENCOUNTER — Ambulatory Visit (HOSPITAL_COMMUNITY): Payer: Commercial Managed Care - HMO | Admitting: Emergency Medicine

## 2016-02-11 DIAGNOSIS — F329 Major depressive disorder, single episode, unspecified: Secondary | ICD-10-CM | POA: Insufficient documentation

## 2016-02-11 DIAGNOSIS — M549 Dorsalgia, unspecified: Secondary | ICD-10-CM | POA: Diagnosis not present

## 2016-02-11 DIAGNOSIS — K219 Gastro-esophageal reflux disease without esophagitis: Secondary | ICD-10-CM | POA: Insufficient documentation

## 2016-02-11 DIAGNOSIS — M1990 Unspecified osteoarthritis, unspecified site: Secondary | ICD-10-CM | POA: Diagnosis not present

## 2016-02-11 DIAGNOSIS — D490 Neoplasm of unspecified behavior of digestive system: Secondary | ICD-10-CM | POA: Diagnosis present

## 2016-02-11 DIAGNOSIS — F4310 Post-traumatic stress disorder, unspecified: Secondary | ICD-10-CM | POA: Diagnosis not present

## 2016-02-11 DIAGNOSIS — E785 Hyperlipidemia, unspecified: Secondary | ICD-10-CM | POA: Diagnosis not present

## 2016-02-11 DIAGNOSIS — D110 Benign neoplasm of parotid gland: Secondary | ICD-10-CM | POA: Diagnosis not present

## 2016-02-11 DIAGNOSIS — Z79899 Other long term (current) drug therapy: Secondary | ICD-10-CM | POA: Diagnosis not present

## 2016-02-11 DIAGNOSIS — K118 Other diseases of salivary glands: Secondary | ICD-10-CM | POA: Diagnosis not present

## 2016-02-11 DIAGNOSIS — H409 Unspecified glaucoma: Secondary | ICD-10-CM | POA: Insufficient documentation

## 2016-02-11 DIAGNOSIS — G4730 Sleep apnea, unspecified: Secondary | ICD-10-CM | POA: Insufficient documentation

## 2016-02-11 DIAGNOSIS — Z9989 Dependence on other enabling machines and devices: Secondary | ICD-10-CM | POA: Insufficient documentation

## 2016-02-11 DIAGNOSIS — I10 Essential (primary) hypertension: Secondary | ICD-10-CM | POA: Diagnosis not present

## 2016-02-11 HISTORY — PX: PAROTIDECTOMY: SHX2163

## 2016-02-11 LAB — GLUCOSE, CAPILLARY: Glucose-Capillary: 116 mg/dL — ABNORMAL HIGH (ref 65–99)

## 2016-02-11 SURGERY — EXCISION, PAROTID GLAND
Anesthesia: General | Laterality: Left

## 2016-02-11 MED ORDER — EPHEDRINE SULFATE 50 MG/ML IJ SOLN
INTRAMUSCULAR | Status: AC
  Filled 2016-02-11: qty 1

## 2016-02-11 MED ORDER — CEFAZOLIN SODIUM 1-5 GM-% IV SOLN
INTRAVENOUS | Status: AC
  Administered 2016-02-11: 1 g via INTRAVENOUS
  Filled 2016-02-11: qty 50

## 2016-02-11 MED ORDER — ATORVASTATIN CALCIUM 40 MG PO TABS
40.0000 mg | ORAL_TABLET | Freq: Every day | ORAL | Status: DC
  Administered 2016-02-11 (×2): 40 mg via ORAL
  Filled 2016-02-11 (×2): qty 1

## 2016-02-11 MED ORDER — ONDANSETRON HCL 4 MG/2ML IJ SOLN: 4.0000 mg | Freq: Once | INTRAMUSCULAR | Status: DC | PRN

## 2016-02-11 MED ORDER — 0.9 % SODIUM CHLORIDE (POUR BTL) OPTIME
TOPICAL | Status: DC | PRN
  Administered 2016-02-11: 1000 mL

## 2016-02-11 MED ORDER — FENTANYL CITRATE (PF) 100 MCG/2ML IJ SOLN
25.0000 ug | INTRAMUSCULAR | Status: DC | PRN
  Administered 2016-02-11 (×2): 25 ug via INTRAVENOUS

## 2016-02-11 MED ORDER — POTASSIUM CHLORIDE ER 10 MEQ PO TBCR
10.0000 meq | EXTENDED_RELEASE_TABLET | Freq: Every day | ORAL | Status: DC
  Administered 2016-02-11 – 2016-02-12 (×2): 10 meq via ORAL
  Filled 2016-02-11 (×4): qty 1

## 2016-02-11 MED ORDER — CITALOPRAM HYDROBROMIDE 20 MG PO TABS
40.0000 mg | ORAL_TABLET | Freq: Every day | ORAL | Status: DC
  Administered 2016-02-12: 40 mg via ORAL
  Filled 2016-02-11: qty 2

## 2016-02-11 MED ORDER — ONDANSETRON HCL 4 MG/2ML IJ SOLN
INTRAMUSCULAR | Status: DC | PRN
  Administered 2016-02-11: 4 mg via INTRAVENOUS

## 2016-02-11 MED ORDER — LIDOCAINE HCL (CARDIAC) 20 MG/ML IV SOLN
INTRAVENOUS | Status: DC | PRN
  Administered 2016-02-11: 40 mg via INTRAVENOUS

## 2016-02-11 MED ORDER — LACTATED RINGERS IV SOLN
INTRAVENOUS | Status: DC | PRN
  Administered 2016-02-11: 07:00:00 via INTRAVENOUS

## 2016-02-11 MED ORDER — METOPROLOL TARTRATE 100 MG PO TABS
100.0000 mg | ORAL_TABLET | Freq: Every day | ORAL | Status: DC
  Administered 2016-02-12: 100 mg via ORAL
  Filled 2016-02-11: qty 1

## 2016-02-11 MED ORDER — ROCURONIUM BROMIDE 50 MG/5ML IV SOLN
INTRAVENOUS | Status: AC
  Filled 2016-02-11: qty 1

## 2016-02-11 MED ORDER — PROPOFOL 10 MG/ML IV BOLUS
INTRAVENOUS | Status: AC
  Filled 2016-02-11: qty 20

## 2016-02-11 MED ORDER — FENTANYL CITRATE (PF) 100 MCG/2ML IJ SOLN
INTRAMUSCULAR | Status: DC | PRN
  Administered 2016-02-11: 100 ug via INTRAVENOUS

## 2016-02-11 MED ORDER — LISINOPRIL 20 MG PO TABS
20.0000 mg | ORAL_TABLET | Freq: Every day | ORAL | Status: DC
  Administered 2016-02-11 – 2016-02-12 (×2): 20 mg via ORAL
  Filled 2016-02-11 (×2): qty 1

## 2016-02-11 MED ORDER — VITAMIN D 1000 UNITS PO TABS
3000.0000 [IU] | ORAL_TABLET | Freq: Every day | ORAL | Status: DC
  Administered 2016-02-12: 3000 [IU] via ORAL
  Filled 2016-02-11: qty 3

## 2016-02-11 MED ORDER — LISINOPRIL-HYDROCHLOROTHIAZIDE 20-25 MG PO TABS: 1.0000 | ORAL_TABLET | Freq: Every day | ORAL | Status: DC

## 2016-02-11 MED ORDER — PROMETHAZINE HCL 25 MG PO TABS: 12.5000 mg | ORAL_TABLET | Freq: Four times a day (QID) | ORAL | Status: DC | PRN

## 2016-02-11 MED ORDER — ONDANSETRON HCL 4 MG/2ML IJ SOLN
INTRAMUSCULAR | Status: AC
  Filled 2016-02-11: qty 2

## 2016-02-11 MED ORDER — LIDOCAINE-EPINEPHRINE 1 %-1:100000 IJ SOLN
INTRAMUSCULAR | Status: AC
  Filled 2016-02-11: qty 1

## 2016-02-11 MED ORDER — SUCCINYLCHOLINE CHLORIDE 20 MG/ML IJ SOLN
INTRAMUSCULAR | Status: AC
  Filled 2016-02-11: qty 1

## 2016-02-11 MED ORDER — TRAZODONE HCL 50 MG PO TABS
50.0000 mg | ORAL_TABLET | Freq: Every day | ORAL | Status: DC
  Administered 2016-02-11: 50 mg via ORAL
  Filled 2016-02-11: qty 1

## 2016-02-11 MED ORDER — OXYCODONE HCL 5 MG/5ML PO SOLN: 5.0000 mg | Freq: Once | ORAL | Status: DC | PRN

## 2016-02-11 MED ORDER — PROPOFOL 10 MG/ML IV BOLUS
INTRAVENOUS | Status: DC | PRN
  Administered 2016-02-11 (×2): 30 mg via INTRAVENOUS
  Administered 2016-02-11: 170 mg via INTRAVENOUS

## 2016-02-11 MED ORDER — MORPHINE SULFATE (PF) 2 MG/ML IV SOLN: 2.0000 mg | INTRAVENOUS | Status: DC | PRN

## 2016-02-11 MED ORDER — FENTANYL CITRATE (PF) 100 MCG/2ML IJ SOLN
INTRAMUSCULAR | Status: AC
  Filled 2016-02-11: qty 2

## 2016-02-11 MED ORDER — PHENYLEPHRINE HCL 10 MG/ML IJ SOLN
10.0000 mg | INTRAVENOUS | Status: DC | PRN
  Administered 2016-02-11: 25 ug/min via INTRAVENOUS

## 2016-02-11 MED ORDER — HYDROCODONE-ACETAMINOPHEN 5-325 MG PO TABS
ORAL_TABLET | ORAL | Status: AC
  Administered 2016-02-12: 2 via ORAL
  Filled 2016-02-11: qty 1

## 2016-02-11 MED ORDER — HYDROCODONE-ACETAMINOPHEN 5-325 MG PO TABS
1.0000 | ORAL_TABLET | ORAL | Status: DC | PRN
  Administered 2016-02-11: 1 via ORAL
  Administered 2016-02-12: 2 via ORAL
  Filled 2016-02-11: qty 2

## 2016-02-11 MED ORDER — BACITRACIN ZINC 500 UNIT/GM EX OINT
TOPICAL_OINTMENT | CUTANEOUS | Status: AC
  Filled 2016-02-11: qty 28.35

## 2016-02-11 MED ORDER — FENTANYL CITRATE (PF) 250 MCG/5ML IJ SOLN
INTRAMUSCULAR | Status: AC
  Filled 2016-02-11: qty 5

## 2016-02-11 MED ORDER — MIDAZOLAM HCL 5 MG/5ML IJ SOLN
INTRAMUSCULAR | Status: DC | PRN
  Administered 2016-02-11: .5 mg via INTRAVENOUS

## 2016-02-11 MED ORDER — HYDROCHLOROTHIAZIDE 25 MG PO TABS
25.0000 mg | ORAL_TABLET | Freq: Every day | ORAL | Status: DC
  Administered 2016-02-11 – 2016-02-12 (×2): 25 mg via ORAL
  Filled 2016-02-11 (×2): qty 1

## 2016-02-11 MED ORDER — SUCCINYLCHOLINE CHLORIDE 20 MG/ML IJ SOLN
INTRAMUSCULAR | Status: DC | PRN
  Administered 2016-02-11: 120 mg via INTRAVENOUS

## 2016-02-11 MED ORDER — PROMETHAZINE HCL 25 MG RE SUPP: 12.5000 mg | Freq: Four times a day (QID) | RECTAL | Status: DC | PRN

## 2016-02-11 MED ORDER — EPHEDRINE SULFATE 50 MG/ML IJ SOLN
INTRAMUSCULAR | Status: DC | PRN
  Administered 2016-02-11: 10 mg via INTRAVENOUS
  Administered 2016-02-11: 5 mg via INTRAVENOUS

## 2016-02-11 MED ORDER — CEFAZOLIN SODIUM 1-5 GM-% IV SOLN
1.0000 g | Freq: Three times a day (TID) | INTRAVENOUS | Status: AC
  Administered 2016-02-11 – 2016-02-12 (×3): 1 g via INTRAVENOUS
  Filled 2016-02-11 (×3): qty 50

## 2016-02-11 MED ORDER — CEFAZOLIN SODIUM-DEXTROSE 2-3 GM-% IV SOLR
INTRAVENOUS | Status: DC | PRN
  Administered 2016-02-11: 2 g via INTRAVENOUS

## 2016-02-11 MED ORDER — LIDOCAINE-EPINEPHRINE 1 %-1:100000 IJ SOLN
INTRAMUSCULAR | Status: DC | PRN
  Administered 2016-02-11: 20 mL

## 2016-02-11 MED ORDER — BACITRACIN ZINC 500 UNIT/GM EX OINT
1.0000 "application " | TOPICAL_OINTMENT | Freq: Three times a day (TID) | CUTANEOUS | Status: DC
  Administered 2016-02-11 – 2016-02-12 (×2): 1 via TOPICAL
  Filled 2016-02-11: qty 28.35

## 2016-02-11 MED ORDER — LIDOCAINE HCL (CARDIAC) 20 MG/ML IV SOLN
INTRAVENOUS | Status: AC
  Filled 2016-02-11: qty 5

## 2016-02-11 MED ORDER — OXYCODONE HCL 5 MG PO TABS: 5.0000 mg | ORAL_TABLET | Freq: Once | ORAL | Status: DC | PRN

## 2016-02-11 MED ORDER — MIDAZOLAM HCL 2 MG/2ML IJ SOLN
INTRAMUSCULAR | Status: AC
  Filled 2016-02-11: qty 2

## 2016-02-11 MED ORDER — KCL IN DEXTROSE-NACL 20-5-0.45 MEQ/L-%-% IV SOLN
INTRAVENOUS | Status: DC
  Administered 2016-02-11: 19:00:00 via INTRAVENOUS
  Filled 2016-02-11: qty 1000

## 2016-02-11 MED ORDER — BACITRACIN ZINC 500 UNIT/GM EX OINT
TOPICAL_OINTMENT | CUTANEOUS | Status: DC | PRN
  Administered 2016-02-11: 1 via TOPICAL

## 2016-02-11 SURGICAL SUPPLY — 56 items
APL SKNCLS STERI-STRIP NONHPOA (GAUZE/BANDAGES/DRESSINGS) ×1
ATTRACTOMAT 16X20 MAGNETIC DRP (DRAPES) ×3 IMPLANT
BENZOIN TINCTURE PRP APPL 2/3 (GAUZE/BANDAGES/DRESSINGS) ×2 IMPLANT
BLADE SURG 15 STRL LF DISP TIS (BLADE) IMPLANT
BLADE SURG 15 STRL SS (BLADE)
BLADE SURG ROTATE 9660 (MISCELLANEOUS) IMPLANT
CANISTER SUCTION 2500CC (MISCELLANEOUS) ×3 IMPLANT
CLEANER TIP ELECTROSURG 2X2 (MISCELLANEOUS) ×3 IMPLANT
CONT SPEC 4OZ CLIKSEAL STRL BL (MISCELLANEOUS) ×4 IMPLANT
CORDS BIPOLAR (ELECTRODE) ×3 IMPLANT
COVER SURGICAL LIGHT HANDLE (MISCELLANEOUS) ×3 IMPLANT
CRADLE DONUT ADULT HEAD (MISCELLANEOUS) ×4 IMPLANT
DRAIN JACKSON RD 7FR 3/32 (WOUND CARE) ×2 IMPLANT
DRAPE INCISE 13X13 STRL (DRAPES) ×3 IMPLANT
DRAPE PROXIMA HALF (DRAPES) ×2 IMPLANT
ELECT COATED BLADE 2.86 ST (ELECTRODE) ×3 IMPLANT
ELECT PAIRED SUBDERMAL (MISCELLANEOUS) ×3
ELECT REM PT RETURN 9FT ADLT (ELECTROSURGICAL) ×3
ELECTRODE PAIRED SUBDERMAL (MISCELLANEOUS) ×1 IMPLANT
ELECTRODE REM PT RTRN 9FT ADLT (ELECTROSURGICAL) ×1 IMPLANT
EVACUATOR SILICONE 100CC (DRAIN) ×3 IMPLANT
FORCEPS TISS BAYO ENTCEPS (INSTRUMENTS) IMPLANT
GAUZE SPONGE 4X4 16PLY XRAY LF (GAUZE/BANDAGES/DRESSINGS) ×3 IMPLANT
GLOVE BIO SURGEON STRL SZ 6.5 (GLOVE) ×1 IMPLANT
GLOVE BIO SURGEON STRL SZ7.5 (GLOVE) ×3 IMPLANT
GLOVE BIO SURGEON STRL SZ8 (GLOVE) ×4 IMPLANT
GLOVE BIO SURGEONS STRL SZ 6.5 (GLOVE) ×1
GLOVE BIOGEL PI IND STRL 7.0 (GLOVE) IMPLANT
GLOVE BIOGEL PI IND STRL 8.5 (GLOVE) IMPLANT
GLOVE BIOGEL PI INDICATOR 7.0 (GLOVE) ×2
GLOVE BIOGEL PI INDICATOR 8.5 (GLOVE) ×4
GLOVE SURG SS PI 7.0 STRL IVOR (GLOVE) ×2 IMPLANT
GOWN STRL REUS W/ TWL LRG LVL3 (GOWN DISPOSABLE) ×2 IMPLANT
GOWN STRL REUS W/ TWL XL LVL3 (GOWN DISPOSABLE) IMPLANT
GOWN STRL REUS W/TWL LRG LVL3 (GOWN DISPOSABLE) ×6
GOWN STRL REUS W/TWL XL LVL3 (GOWN DISPOSABLE) ×6
KIT BASIN OR (CUSTOM PROCEDURE TRAY) ×3 IMPLANT
KIT ROOM TURNOVER OR (KITS) ×3 IMPLANT
NDL HYPO 25GX1X1/2 BEV (NEEDLE) IMPLANT
NEEDLE HYPO 25GX1X1/2 BEV (NEEDLE) ×3 IMPLANT
NS IRRIG 1000ML POUR BTL (IV SOLUTION) ×4 IMPLANT
PAD ARMBOARD 7.5X6 YLW CONV (MISCELLANEOUS) ×4 IMPLANT
PENCIL BUTTON HOLSTER BLD 10FT (ELECTRODE) ×3 IMPLANT
PROBE NERVBE PRASS .33 (MISCELLANEOUS) ×3 IMPLANT
SPECIMEN JAR MEDIUM (MISCELLANEOUS) ×1 IMPLANT
STAPLER VISISTAT 35W (STAPLE) ×3 IMPLANT
SUT ETHILON 2 0 FS 18 (SUTURE) ×3 IMPLANT
SUT ETHILON 5 0 P 3 18 (SUTURE) ×2
SUT NYLON ETHILON 5-0 P-3 1X18 (SUTURE) ×1 IMPLANT
SUT SILK 2 0 FS (SUTURE) ×5 IMPLANT
SUT SILK 3 0 REEL (SUTURE) IMPLANT
SUT SILK 3 0 SH CR/8 (SUTURE) ×3 IMPLANT
SUT VIC AB 3-0 SH 27 (SUTURE) ×3
SUT VIC AB 3-0 SH 27X BRD (SUTURE) ×1 IMPLANT
SUT VIC AB 4-0 PS2 27 (SUTURE) IMPLANT
TRAY ENT MC OR (CUSTOM PROCEDURE TRAY) ×3 IMPLANT

## 2016-02-11 NOTE — Anesthesia Postprocedure Evaluation (Signed)
Anesthesia Post Note  Patient: Jeremiah [name]  Procedure(s) Performed: Procedure(s) (LRB): PAROTIDECTOMY (Left)  Patient location during evaluation: PACU Anesthesia Type: General Level of consciousness: awake, awake and alert and oriented Pain management: pain level controlled Vital Signs Assessment: post-procedure vital signs reviewed and stable Respiratory status: spontaneous breathing, nonlabored ventilation and respiratory function stable Anesthetic complications: no    Last Vitals:  Filed Vitals:   02/11/16 1238 02/11/16 1411  BP: 129/74 143/83  Pulse: 53   Temp:    Resp: 12 15    Last Pain:  Filed Vitals:   02/11/16 1420  PainSc: 6                  Akia Desroches COKER

## 2016-02-11 NOTE — Anesthesia Procedure Notes (Signed)
Procedure Name: Intubation Date/Time: 02/11/2016 7:41 AM Performed by: Tressia Miners LEFFEW Pre-anesthesia Checklist: Patient identified, Patient being monitored, Timeout performed, Emergency Drugs available and Suction available Patient Re-evaluated:Patient Re-evaluated prior to inductionOxygen Delivery Method: Circle System Utilized Preoxygenation: Pre-oxygenation with 100% oxygen Intubation Type: IV induction Laryngoscope Size: Mac and 4 Grade View: Grade II Tube type: Oral Tube size: 7.5 mm Number of attempts: 1 Airway Equipment and Method: Stylet Placement Confirmation: ETT inserted through vocal cords under direct vision,  positive ETCO2 and breath sounds checked- equal and bilateral Secured at: 22 cm Tube secured with: Tape Dental Injury: Teeth and Oropharynx as per pre-operative assessment

## 2016-02-11 NOTE — Brief Op Note (Signed)
02/11/2016  8:50 AM  PATIENT:  Jeremiah [name]  75 y.o. male  PRE-OPERATIVE DIAGNOSIS:  LEFT  PAROTID MASS  POST-OPERATIVE DIAGNOSIS:  LEFT  PAROTID MASS  PROCEDURE:  Procedure(s): PAROTIDECTOMY (Left)  SURGEON:  Surgeon(s) and Role:    * Melida Quitter, MD - Primary  PHYSICIAN ASSISTANT:  [place]  ASSISTANTS: none   ANESTHESIA:   general  EBL:  Total I/O In: 800 [I.V.:800] Out: 25 [Blood:25]  BLOOD ADMINISTERED:none  DRAINS: (7 Fr) Jackson-Pratt drain(s) with closed bulb suction in the left neck   LOCAL MEDICATIONS USED:  LIDOCAINE   SPECIMEN:  Source of Specimen:  Left parotid mass  DISPOSITION OF SPECIMEN:  PATHOLOGY  COUNTS:  YES  TOURNIQUET:  * No tourniquets in log *  DICTATION: .Other Dictation: Dictation Number [id number]  PLAN OF CARE: Admit for overnight observation  PATIENT DISPOSITION:  PACU - hemodynamically stable.   Delay start of Pharmacological VTE agent (>24hrs) due to surgical blood loss or risk of bleeding: yes

## 2016-02-11 NOTE — Transfer of Care (Signed)
Immediate Anesthesia Transfer of Care Note  Patient: Jeremiah [name]  Procedure(s) Performed: Procedure(s): PAROTIDECTOMY (Left)  Patient Location: PACU  Anesthesia Type:General  Level of Consciousness: awake, alert , oriented, patient cooperative and responds to stimulation  Airway & Oxygen Therapy: Patient Spontanous Breathing and Patient connected to nasal cannula oxygen  Post-op Assessment: Report given to RN, Post -op Vital signs reviewed and stable and Patient moving all extremities X 4  Post vital signs: Reviewed and stable  Last Vitals:  Filed Vitals:   02/11/16 0616 02/11/16 0906  BP: 139/79 125/71  Pulse: 51 66  Temp: 36.8 C 36.8 C  Resp: 20 12    Complications: No apparent anesthesia complications

## 2016-02-11 NOTE — Op Note (Signed)
NAMEJUNNIE, Jeremiah [name] NO.:  [id number]  MEDICAL RECORD NO.:  [id number]  LOCATION:                                 FACILITY:  PHYSICIAN:  Onnie Graham, MD     DATE OF BIRTH:  15-Jun-1941  DATE OF PROCEDURE:  02/11/2016 DATE OF DISCHARGE:                              OPERATIVE REPORT   PREOPERATIVE DIAGNOSIS:  Left parotid neoplasm.  POSTOPERATIVE DIAGNOSIS:  Left parotid neoplasm.  PROCEDURE:  Left lateral parotidectomy with facial nerve dissection.  SURGEON:  Leane Para. Redmond Baseman, MD.  ASSISTANTBarbaraann Share [place], PA  ANESTHESIA:  General endotracheal anesthesia.  COMPLICATIONS:  None.  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  He presents to the operating room for surgical management.  FINDINGS:  There was a round mass found in the left parotid tail and was removed and sent for pathology.  DESCRIPTION OF PROCEDURE:  The patient was identified in the holding room and informed consent having been obtained including discussion of risks, benefits, alternatives, the patient was brought to the operative suite, and put on the operating table in supine position.  Anesthesia was induced.  The patient was intubated by the Anesthesia team without difficulty.  The patient was given intravenous antibiotics during the case.  A shoulder roll was placed, and the eyes were taped closed.  The facial nerve monitor was placed in the face in standard fashion and turned on during the case.  The parotid incision was marked with a marking pen and injected with 1% lidocaine with 1:100,000 epinephrine. The left face and neck were prepped and draped in sterile fashion including a clear drape over the face.  The incision was made with a 15 blade scalpel starting in the preauricular crease and curving down onto the neck a bit.  The incision was further  dissected with a Bovie electrocautery down to the parotid fascia.  A pre-parotid flap was then elevated anteriorly and the earlobe was also dissected free.  These were sutured back for retraction.  Dissection was then performed along the tragal cartilage down to the stylomastoid foramen until the nerve was identified.  The nerve was then dissected in an antegrade fashion following the inferior division dividing the parotid gland over the nerve keeping the nerve safe.  This allowed the mass to be separated from the region of the gland where the nerve resided.  This portion of the gland which comprised the parotid tail was then dissected free from surrounding tissues still keeping the nerve in safety dissecting the gland off the sternocleidomastoid muscle until the parotid tail was removed.  This was passed to Nursing for pathology.  Bleeding was controlled with bipolar electrocautery.  The stimulator probe was then used to stimulate the nerve and easily stimulated the inferior division. The wound was copiously irrigated with saline.  A 7-French round drain was placed in the depth of the wound and secured to the inferior extent of the incision using a 2-0 nylon suture in a standard drain stitch. The flaps were laid  back in place, and the skin was closed in subcutaneous layer using 3-0 Vicryl suture in a simple interrupted and running fashion.  The skin was closed with 5-0 nylon in a simple running fashion.  The drain was hooked to suction during closure and was then placed to bulb suction.  Drapes were removed, and the patient was cleaned off.  The drain was taped to the left shoulder.  He was returned to Anesthesia for wake-up, was extubated, and moved to the recovery room in stable condition.     Onnie Graham, MD     DDB/MEDQ  D:  02/11/2016  T:  02/11/2016  Job:  [id number]  cc:   Dr. Redmond Baseman' office

## 2016-02-11 NOTE — Anesthesia Preprocedure Evaluation (Addendum)
Anesthesia Evaluation  Patient identified by MRN, date of birth, ID band Patient awake    Reviewed: Allergy & Precautions, NPO status , Patient's Chart, lab work & pertinent test results  History of Anesthesia Complications Negative for: history of anesthetic complications  Airway Mallampati: II  TM Distance: >3 FB Neck ROM: Full    Dental  (+) Dental Advisory Given, Poor Dentition, Chipped,    Pulmonary sleep apnea and Continuous Positive Airway Pressure Ventilation , Current Smoker,    breath sounds clear to auscultation       Cardiovascular hypertension, Pt. on home beta blockers and Pt. on medications  Rhythm:Regular Rate:Normal     Neuro/Psych Anxiety    GI/Hepatic   Endo/Other    Renal/GU      Musculoskeletal  (+) Arthritis ,   Abdominal   Peds  Hematology   Anesthesia Other Findings   Reproductive/Obstetrics                           Anesthesia Physical Anesthesia Plan  ASA: III  Anesthesia Plan: General   Post-op Pain Management:    Induction: Intravenous  Airway Management Planned: Oral ETT  Additional Equipment:   Intra-op Plan:   Post-operative Plan: Extubation in OR  Informed Consent: I have reviewed the patients History and Physical, chart, labs and discussed the procedure including the risks, benefits and alternatives for the proposed anesthesia with the patient or authorized representative who has indicated his/her understanding and acceptance.   Dental advisory given  Plan Discussed with: CRNA and Anesthesiologist  Anesthesia Plan Comments:        Anesthesia Quick Evaluation

## 2016-02-11 NOTE — H&P (Signed)
Jeremiah [name] is an 75 y.o. male.   Chief Complaint: left parotid mass HPI: 75 year old male with 8 year history of left parotid mass with intermittent pain but stable size.  He presents for surgical management.  Past Medical History  Diagnosis Date  . Hyperlipidemia     takes Atorvastatin daily  . Depression     takes Citalopram daily  . Insomnia     takes Trazodone nightly  . Hypertension     takes Metoprolol and Lisinopril-HCTZ daily  . Sleep apnea     uses CPAP..  . Arthritis   . Parotid mass     left  . Back pain   . GERD (gastroesophageal reflux disease)     occasionally  . GI bleeding   . History of colon polyps     benign  . Diverticulosis   . Urinary frequency   . History of blood transfusion   . Glaucoma   . PTSD (post-traumatic stress disorder)     takes Citalopram daily  . Anxiety     Past Surgical History  Procedure Laterality Date  . Partial colectomy      d/t tear in colon   . Colonoscopy      History reviewed. No pertinent family history. Social History:  reports that he has been smoking.  He does not have any smokeless tobacco history on file. He reports that he drinks alcohol. He reports that he does not use illicit drugs.  Allergies: No Known Allergies  Medications Prior to Admission  Medication Sig Dispense Refill  . atorvastatin (LIPITOR) 40 MG tablet Take 40 mg by mouth daily.    . cholecalciferol (VITAMIN D) 1000 units tablet Take 3,000 Units by mouth daily.    . citalopram (CELEXA) 40 MG tablet Take 40 mg by mouth daily.    Marland Kitchen lisinopril-hydrochlorothiazide (PRINZIDE,ZESTORETIC) 20-25 MG tablet Take 1 tablet by mouth daily.    . metoprolol (LOPRESSOR) 50 MG tablet Take 100 mg by mouth daily.    . NON FORMULARY PT HAS A CPAP MACHINE    . potassium chloride (K-DUR) 10 MEQ tablet Take 10 mEq by mouth daily.    . traZODone (DESYREL) 50 MG tablet Take 50 mg by mouth at bedtime.      Results for orders placed or performed during the  hospital encounter of 02/11/16 (from the past 48 hour(s))  Glucose, capillary     Status: Abnormal   Collection Time: 02/11/16  6:59 AM  Result Value Ref Range   Glucose-Capillary 116 (H) 65 - 99 mg/dL   No results found.  Review of Systems  All other systems reviewed and are negative.   Blood pressure 139/79, pulse 51, temperature 98.3 F (36.8 C), temperature source Oral, resp. rate 20, weight 89.812 kg (198 lb), SpO2 100 %. Physical Exam  Constitutional: He is oriented to person, place, and time. He appears well-developed and well-nourished. No distress.  HENT:  Head: Normocephalic and atraumatic.  Right Ear: External ear normal.  Left Ear: External ear normal.  Nose: Nose normal.  Mouth/Throat: Oropharynx is clear and moist.  Eyes: Conjunctivae and EOM are normal. Pupils are equal, round, and reactive to light.  Neck: Normal range of motion. Neck supple.  Left parotid tail with 2.5 cm round, mobile mass.  Cardiovascular: Normal rate.   Respiratory: Effort normal.  Musculoskeletal: Normal range of motion.  Neurological: He is alert and oriented to person, place, and time. No cranial nerve deficit.  Skin: Skin is  warm and dry.  Psychiatric: He has a normal mood and affect. His behavior is normal. Judgment and thought content normal.     Assessment/Plan Left parotid mass To OR for left parotidectomy.  Plan overnight observation with drain in place.  Melida Quitter, MD 02/11/2016, 7:22 AM

## 2016-02-12 ENCOUNTER — Encounter (HOSPITAL_COMMUNITY): Payer: Self-pay | Admitting: Otolaryngology

## 2016-02-12 DIAGNOSIS — K219 Gastro-esophageal reflux disease without esophagitis: Secondary | ICD-10-CM | POA: Diagnosis not present

## 2016-02-12 DIAGNOSIS — E785 Hyperlipidemia, unspecified: Secondary | ICD-10-CM | POA: Diagnosis not present

## 2016-02-12 DIAGNOSIS — H409 Unspecified glaucoma: Secondary | ICD-10-CM | POA: Diagnosis not present

## 2016-02-12 DIAGNOSIS — G4730 Sleep apnea, unspecified: Secondary | ICD-10-CM | POA: Diagnosis not present

## 2016-02-12 DIAGNOSIS — D110 Benign neoplasm of parotid gland: Secondary | ICD-10-CM | POA: Diagnosis not present

## 2016-02-12 DIAGNOSIS — F4310 Post-traumatic stress disorder, unspecified: Secondary | ICD-10-CM | POA: Diagnosis not present

## 2016-02-12 DIAGNOSIS — I10 Essential (primary) hypertension: Secondary | ICD-10-CM | POA: Diagnosis not present

## 2016-02-12 DIAGNOSIS — M1990 Unspecified osteoarthritis, unspecified site: Secondary | ICD-10-CM | POA: Diagnosis not present

## 2016-02-12 DIAGNOSIS — F329 Major depressive disorder, single episode, unspecified: Secondary | ICD-10-CM | POA: Diagnosis not present

## 2016-02-12 MED ORDER — HYDROCODONE-ACETAMINOPHEN 5-325 MG PO TABS: 1.0000 | ORAL_TABLET | ORAL | Status: AC | PRN

## 2016-02-12 NOTE — Discharge Summary (Signed)
Physician Discharge Summary  Patient ID: Jeremiah [name] MRN: [id number] DOB/AGE: [birth year] 75 y.o.  Admit date: 02/11/2016 Discharge date: 02/12/2016  Admission Diagnoses: Left parotid neoplasm  Discharge Diagnoses:  Active Problems:   Parotid neoplasm   Discharged Condition: good  Hospital Course: 75 year old male with 8 year history of left parotid mass presented for excision.  See operative note.  Observed overnight without problems.  Drain was removed on POD 1 and he was felt stable for discharge.  Consults: None  Significant Diagnostic Studies: None  Treatments: surgery: Left parotidectomy  Discharge Exam: Blood pressure 151/72, pulse 62, temperature 98.3 F (36.8 C), temperature source Oral, resp. rate 18, weight 89.812 kg (198 lb), SpO2 97 %. General appearance: alert, cooperative and no distress Neck: left parotid incision clean and intact, no fluid collection, drain removed, normal facial movement  Disposition: 07-Left Against Medical Advice  Discharge Instructions    Diet - low sodium heart healthy    Complete by:  As directed      Discharge instructions    Complete by:  As directed   Avoid strenuous activity.  Bland diet only, no spicy or sour.  Apply antibiotic ointment such as Neosporin or Bacitracin to incision twice daily.  OK to allow incision to get wet with bathing starting tomorrow, gently pat dry.     Increase activity slowly    Complete by:  As directed             Medication List    TAKE these medications        atorvastatin 40 MG tablet  Commonly known as:  LIPITOR  Take 40 mg by mouth daily.     cholecalciferol 1000 units tablet  Commonly known as:  VITAMIN D  Take 3,000 Units by mouth daily.     citalopram 40 MG tablet  Commonly known as:  CELEXA  Take 40 mg by mouth daily.     HYDROcodone-acetaminophen 5-325 MG tablet  Commonly known as:  NORCO/VICODIN  Take 1-2 tablets by mouth every 4 (four) hours as needed for moderate pain.      lisinopril-hydrochlorothiazide 20-25 MG tablet  Commonly known as:  PRINZIDE,ZESTORETIC  Take 1 tablet by mouth daily.     metoprolol 50 MG tablet  Commonly known as:  LOPRESSOR  Take 100 mg by mouth daily.     NON FORMULARY  PT HAS A CPAP MACHINE     potassium chloride 10 MEQ tablet  Commonly known as:  K-DUR  Take 10 mEq by mouth daily.     traZODone 50 MG tablet  Commonly known as:  DESYREL  Take 50 mg by mouth at bedtime.           Follow-up Information    Follow up with Hanadi Stanly, MD In 1 week.   Specialty:  Otolaryngology   Contact information:   [address] [place] [address] [phone]       Signed: Melida Quitter 02/12/2016, 8:36 AM

## 2016-02-12 NOTE — Progress Notes (Signed)
Pt discharged home in stable condition. No concerns voiced prior to discharge

## 2016-02-28 DIAGNOSIS — R739 Hyperglycemia, unspecified: Secondary | ICD-10-CM | POA: Diagnosis not present

## 2016-02-28 DIAGNOSIS — K5730 Diverticulosis of large intestine without perforation or abscess without bleeding: Secondary | ICD-10-CM | POA: Diagnosis not present

## 2016-02-28 DIAGNOSIS — F17210 Nicotine dependence, cigarettes, uncomplicated: Secondary | ICD-10-CM | POA: Diagnosis not present

## 2016-02-28 DIAGNOSIS — Z1159 Encounter for screening for other viral diseases: Secondary | ICD-10-CM | POA: Diagnosis not present

## 2016-02-28 DIAGNOSIS — Z0000 Encounter for general adult medical examination without abnormal findings: Secondary | ICD-10-CM | POA: Diagnosis not present

## 2016-02-28 DIAGNOSIS — Z125 Encounter for screening for malignant neoplasm of prostate: Secondary | ICD-10-CM | POA: Diagnosis not present

## 2016-02-28 DIAGNOSIS — D649 Anemia, unspecified: Secondary | ICD-10-CM | POA: Diagnosis not present

## 2016-02-28 DIAGNOSIS — M545 Low back pain: Secondary | ICD-10-CM | POA: Diagnosis not present

## 2016-02-28 DIAGNOSIS — E7800 Pure hypercholesterolemia, unspecified: Secondary | ICD-10-CM | POA: Diagnosis not present

## 2016-02-28 DIAGNOSIS — D490 Neoplasm of unspecified behavior of digestive system: Secondary | ICD-10-CM | POA: Diagnosis not present

## 2016-02-28 DIAGNOSIS — Z1389 Encounter for screening for other disorder: Secondary | ICD-10-CM | POA: Diagnosis not present

## 2016-11-21 IMAGING — CR DG HIP (WITH OR WITHOUT PELVIS) 2-3V*L*
2 series · 2 of 2 positions shown · non-contrast
Comparison: None.

CLINICAL DATA: Chronic low back pain, recent fall at home with
development of left hip pain

EXAM:
LEFT HIP (WITH PELVIS) 2-3 VIEWS

[t pelvis a.p.]
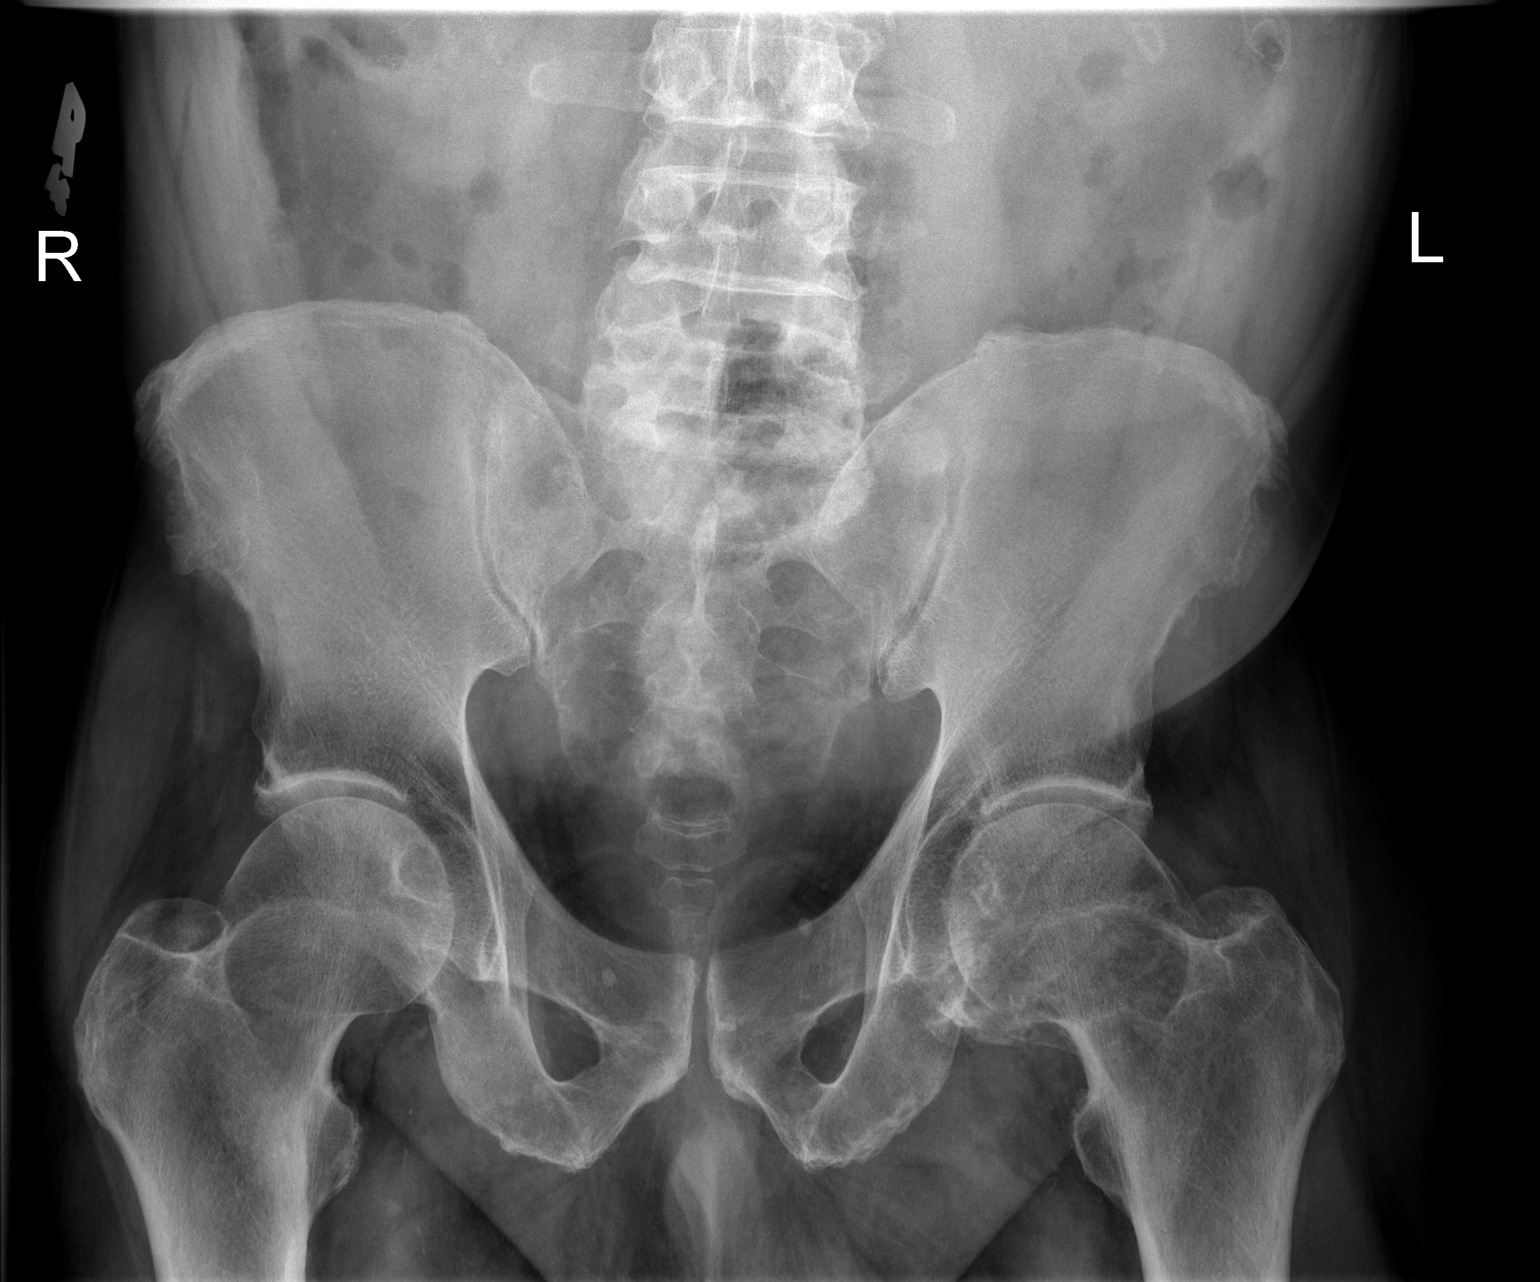

[t hip frog leg left]
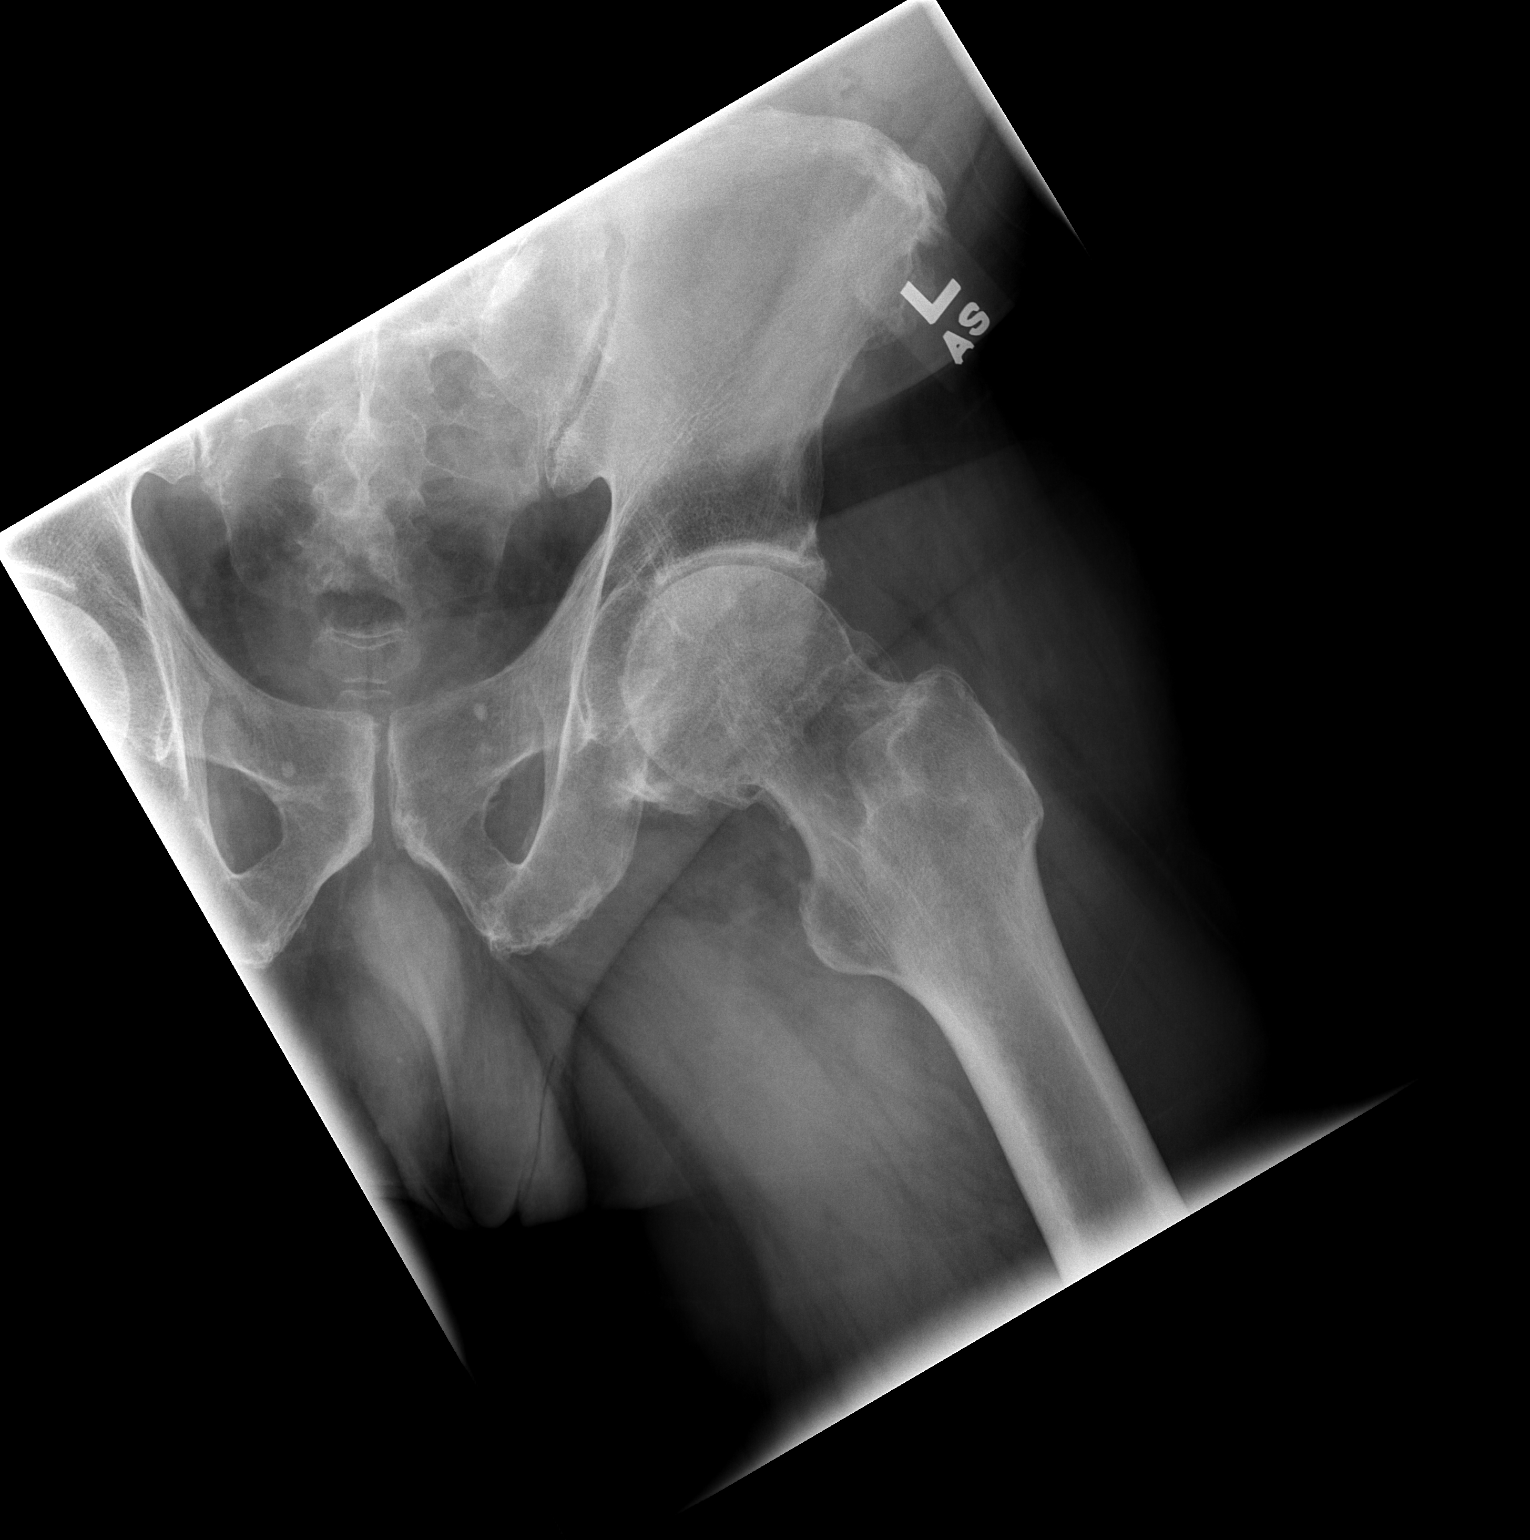

[2 of 2 positions shown; findings below may reference images not displayed]

FINDINGS: The bony pelvis is adequately mineralized. There is no acute or
healing fracture. These sec sacrum and SI joints are unremarkable.

AP and lateral views of the left hip reveal mild symmetric joint
space narrowing. The articular surfaces of the acetabulum and
femoral head remains smoothly rounded. There is a small marginal
osteophyte from the inferior lip of the acetabulum. The femoral neck
and intertrochanteric regions are normal.
IMPRESSION: There is moderate osteoarthritic change of the left hip. There is no
acute fracture nor dislocation.

## 2017-03-06 DIAGNOSIS — I10 Essential (primary) hypertension: Secondary | ICD-10-CM | POA: Diagnosis not present

## 2017-03-06 DIAGNOSIS — D649 Anemia, unspecified: Secondary | ICD-10-CM | POA: Diagnosis not present

## 2017-03-06 DIAGNOSIS — F324 Major depressive disorder, single episode, in partial remission: Secondary | ICD-10-CM | POA: Diagnosis not present

## 2017-03-06 DIAGNOSIS — Z1389 Encounter for screening for other disorder: Secondary | ICD-10-CM | POA: Diagnosis not present

## 2017-03-06 DIAGNOSIS — F17210 Nicotine dependence, cigarettes, uncomplicated: Secondary | ICD-10-CM | POA: Diagnosis not present

## 2017-03-06 DIAGNOSIS — E7800 Pure hypercholesterolemia, unspecified: Secondary | ICD-10-CM | POA: Diagnosis not present

## 2017-03-06 DIAGNOSIS — Z86010 Personal history of colonic polyps: Secondary | ICD-10-CM | POA: Diagnosis not present

## 2017-03-06 DIAGNOSIS — Z125 Encounter for screening for malignant neoplasm of prostate: Secondary | ICD-10-CM | POA: Diagnosis not present

## 2017-03-06 DIAGNOSIS — Z0000 Encounter for general adult medical examination without abnormal findings: Secondary | ICD-10-CM | POA: Diagnosis not present

## 2017-03-06 DIAGNOSIS — F4310 Post-traumatic stress disorder, unspecified: Secondary | ICD-10-CM | POA: Diagnosis not present

## 2017-11-03 IMAGING — CR DG CHEST 2V
2 series · 2 of 2 positions shown · non-contrast
Comparison: None

CLINICAL DATA: Pre-admission testing, soft tissue growth LEFT neck,
chest cold for 2 days, hypertension, GERD, smoker

EXAM:
CHEST  2 VIEW

[w chest pa]
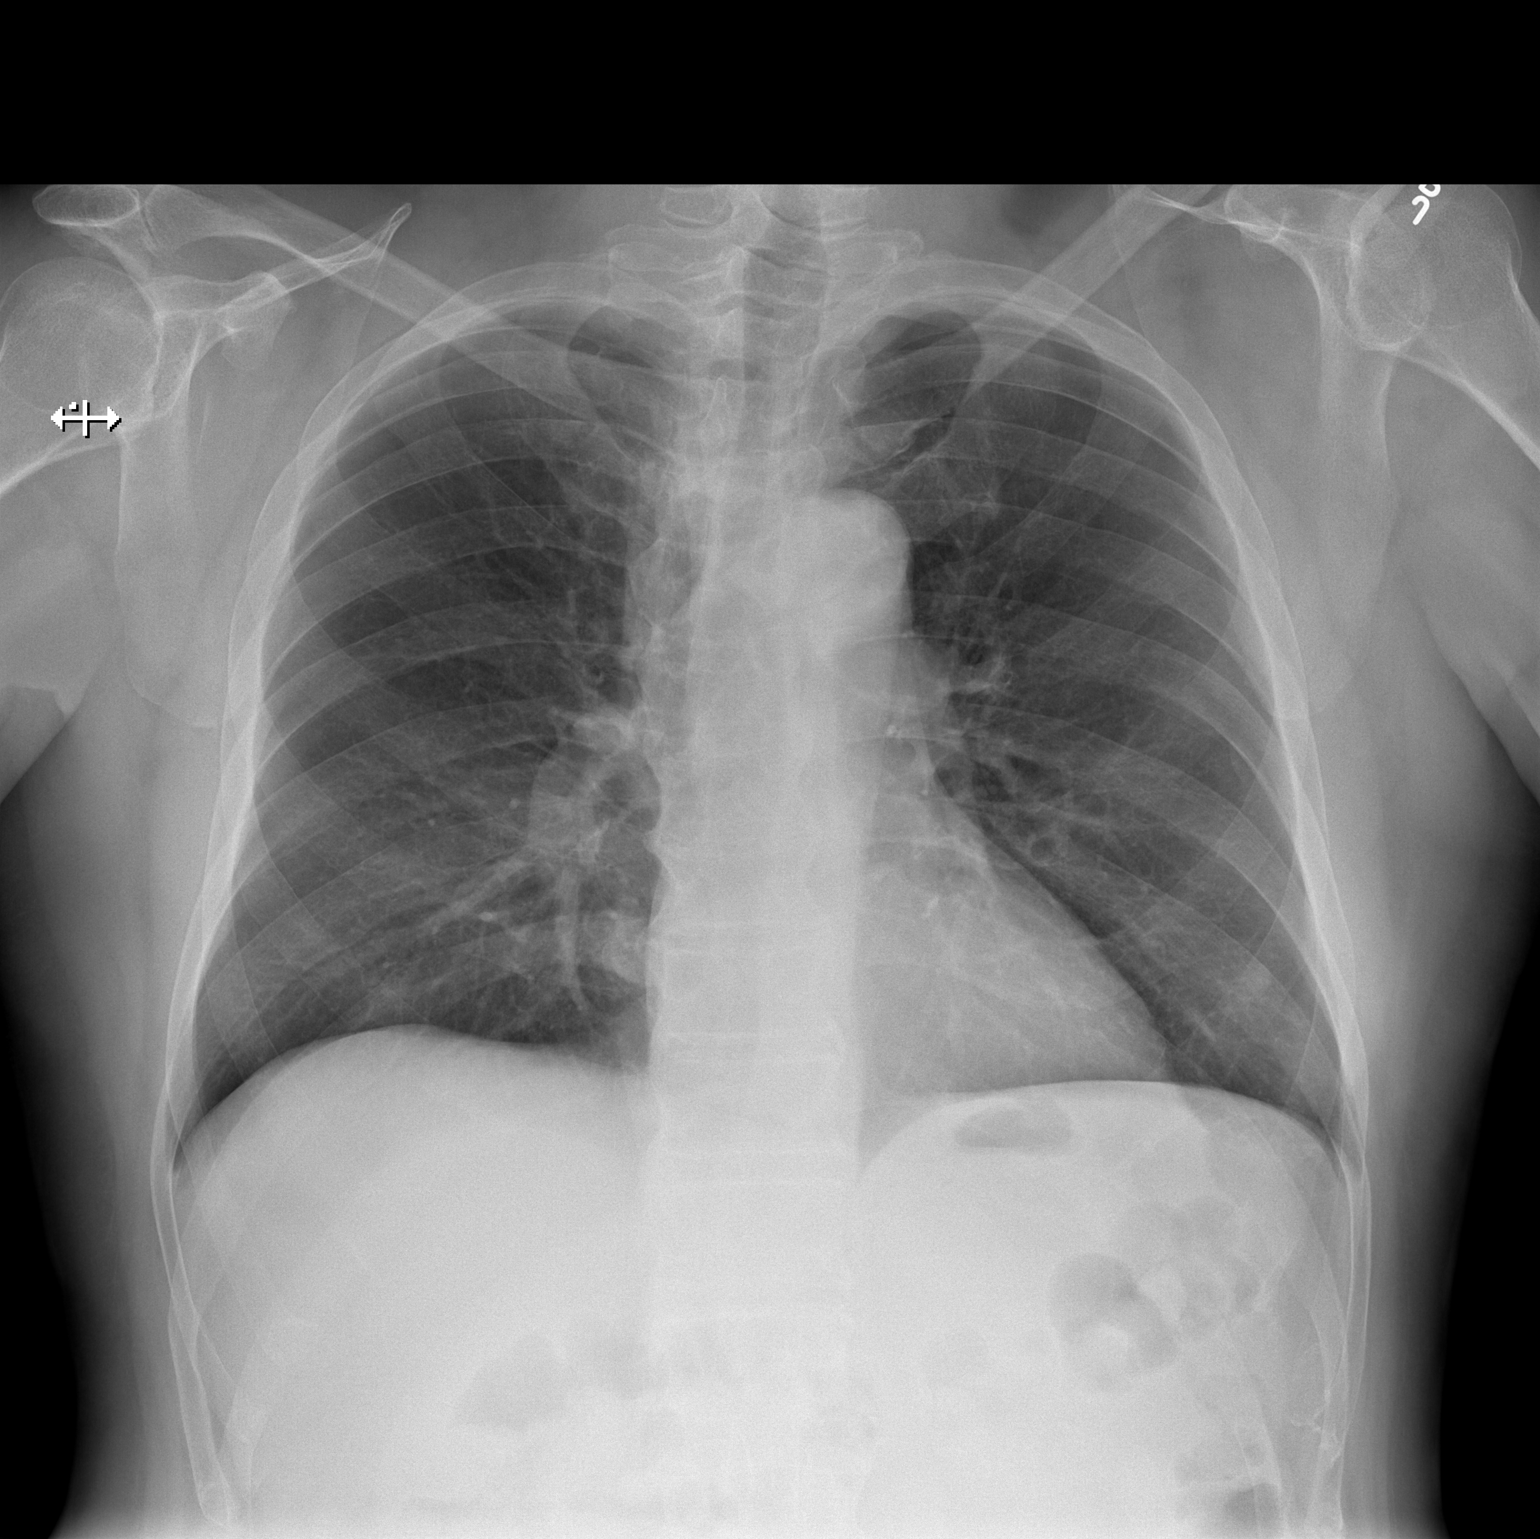

[w chest lat]
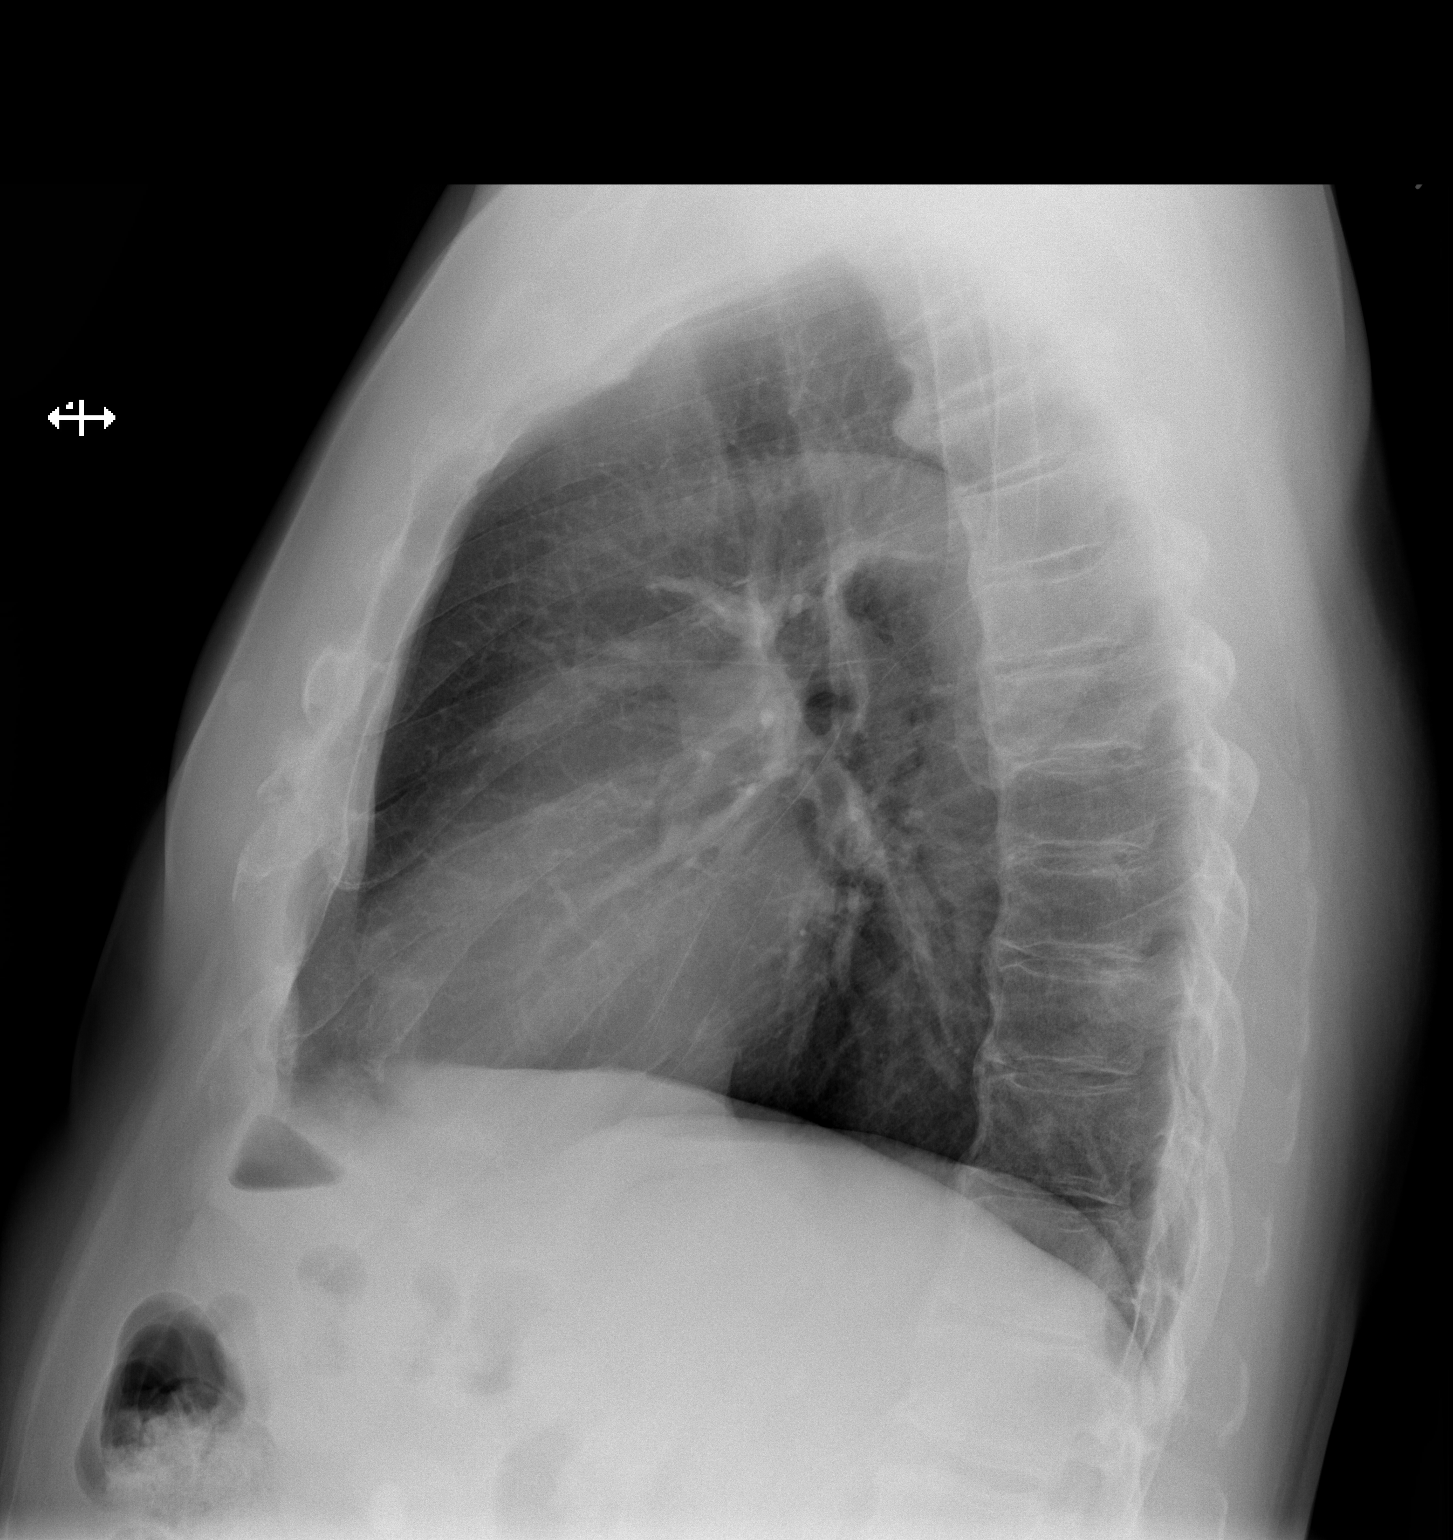

[2 of 2 positions shown; findings below may reference images not displayed]

FINDINGS: Normal heart size, mediastinal contours, and pulmonary vascularity.

Atherosclerotic calcification aorta.

LEFT nipple shadow, confirmed on lateral view.

Lungs clear.

No infiltrate, pleural effusion or pneumothorax.

Minimal endplate spur formation thoracic spine.
IMPRESSION: LEFT nipple shadow.

No acute abnormalities.

## 2018-03-16 DIAGNOSIS — Z125 Encounter for screening for malignant neoplasm of prostate: Secondary | ICD-10-CM | POA: Diagnosis not present

## 2018-03-16 DIAGNOSIS — E7800 Pure hypercholesterolemia, unspecified: Secondary | ICD-10-CM | POA: Diagnosis not present

## 2018-03-16 DIAGNOSIS — F4310 Post-traumatic stress disorder, unspecified: Secondary | ICD-10-CM | POA: Diagnosis not present

## 2018-03-16 DIAGNOSIS — I10 Essential (primary) hypertension: Secondary | ICD-10-CM | POA: Diagnosis not present

## 2018-03-16 DIAGNOSIS — Z1389 Encounter for screening for other disorder: Secondary | ICD-10-CM | POA: Diagnosis not present

## 2018-03-16 DIAGNOSIS — M545 Low back pain: Secondary | ICD-10-CM | POA: Diagnosis not present

## 2018-03-16 DIAGNOSIS — K5730 Diverticulosis of large intestine without perforation or abscess without bleeding: Secondary | ICD-10-CM | POA: Diagnosis not present

## 2018-03-16 DIAGNOSIS — Z0000 Encounter for general adult medical examination without abnormal findings: Secondary | ICD-10-CM | POA: Diagnosis not present

## 2018-03-16 DIAGNOSIS — F324 Major depressive disorder, single episode, in partial remission: Secondary | ICD-10-CM | POA: Diagnosis not present

## 2018-03-18 DIAGNOSIS — R739 Hyperglycemia, unspecified: Secondary | ICD-10-CM | POA: Diagnosis not present

## 2020-07-06 ENCOUNTER — Telehealth: Payer: Self-pay | Admitting: Gastroenterology

## 2020-07-06 NOTE — Telephone Encounter (Signed)
Hi Dr. Silverio Decamp,  We received [facility] for [facility] to have a [facility]. I have obtained the op\path reports from 2018 for you to review.  Please advise on scheduling.  Thank you

## 2020-07-11 ENCOUNTER — Encounter: Payer: Self-pay | Admitting: Gastroenterology

## 2020-08-15 ENCOUNTER — Other Ambulatory Visit: Payer: Self-pay

## 2020-08-15 ENCOUNTER — Ambulatory Visit (AMBULATORY_SURGERY_CENTER): Payer: Self-pay | Admitting: *Deleted

## 2020-08-15 VITALS — Ht 69.5 in | Wt 195.0 lb

## 2020-08-15 DIAGNOSIS — Z86010 Personal history of colonic polyps: Secondary | ICD-10-CM

## 2020-08-15 MED ORDER — PEG-KCL-NACL-NASULF-NA ASC-C 100 G PO SOLR: 1.0000 | Freq: Once | ORAL | 0 refills | Status: AC

## 2020-08-15 NOTE — Progress Notes (Signed)
No egg or soy allergy known to patient  No issues with past sedation with any surgeries or procedures no intubation problems in the past  No FH of Malignant Hyperthermia No diet pills per patient No home 02 use per patient  No blood thinners per patient  Pt denies issues with constipation  No A fib or A flutter  EMMI video to pt or via [place] 19 guidelines implemented in PV today with Pt and RN   Movi prep rx printed and faxed to [facility]  Due to the COVID-19 pandemic we are asking patients to follow these guidelines. Please only bring one care partner. Please be aware that your care partner may wait in the car in the parking lot or if they feel like they will be too hot to wait in the car, they may wait in the lobby on the 4th floor. All care partners are required to wear a mask the entire time (we do not have any that we can provide them), they need to practice social distancing, and we will do a Covid check for all patient's and care partners when you arrive. Also we will check their temperature and your temperature. If the care partner waits in their car they need to stay in the parking lot the entire time and we will call them on their cell phone when the patient is ready for discharge so they can bring the car to the front of the building. Also all patient's will need to wear a mask into building.

## 2020-08-24 ENCOUNTER — Telehealth: Payer: Self-pay | Admitting: Gastroenterology

## 2020-08-24 NOTE — Telephone Encounter (Signed)
pts instructions state that on the day before 9-9 Thursday and pts colon is Thursday 9-9- instructed pt that shoyuld be Wednesday 9-8, he verbalized understanding to change to WED 9-8

## 2020-08-24 NOTE — Telephone Encounter (Signed)
Patient has questions about procedure instructions. Please call him. 

## 2020-08-29 ENCOUNTER — Encounter: Payer: Self-pay | Admitting: Certified Registered Nurse Anesthetist

## 2020-08-30 ENCOUNTER — Encounter: Payer: Self-pay | Admitting: Gastroenterology

## 2020-08-30 ENCOUNTER — Ambulatory Visit (AMBULATORY_SURGERY_CENTER): Payer: No Typology Code available for payment source | Admitting: Gastroenterology

## 2020-08-30 ENCOUNTER — Other Ambulatory Visit: Payer: Self-pay

## 2020-08-30 VITALS — BP 168/84 | HR 58 | Temp 97.5°F | Resp 19 | Ht 69.5 in | Wt 195.0 lb

## 2020-08-30 DIAGNOSIS — Z86010 Personal history of colonic polyps: Secondary | ICD-10-CM

## 2020-08-30 DIAGNOSIS — D128 Benign neoplasm of rectum: Secondary | ICD-10-CM | POA: Diagnosis not present

## 2020-08-30 DIAGNOSIS — D124 Benign neoplasm of descending colon: Secondary | ICD-10-CM

## 2020-08-30 DIAGNOSIS — D123 Benign neoplasm of transverse colon: Secondary | ICD-10-CM

## 2020-08-30 MED ORDER — SODIUM CHLORIDE 0.9 % IV SOLN: 500.0000 mL | Freq: Once | INTRAVENOUS | Status: DC

## 2020-08-30 NOTE — Progress Notes (Signed)
Report given to PACU, vss 

## 2020-08-30 NOTE — Progress Notes (Signed)
Called to room to assist during endoscopic procedure.  Patient ID and intended procedure confirmed with present staff. Received instructions for my participation in the procedure from the performing physician.  

## 2020-08-30 NOTE — Patient Instructions (Signed)
Handouts given for polyps, hemorrhoids and diverticulosis.  YOU HAD AN ENDOSCOPIC PROCEDURE TODAY AT [facility]:   Refer to the procedure report that was given to you for any specific questions about what was found during the examination.  If the procedure report does not answer your questions, please call your gastroenterologist to clarify.  If you requested that your care partner not be given the details of your procedure findings, then the procedure report has been included in a sealed envelope for you to review at your convenience later.  YOU SHOULD EXPECT: Some feelings of bloating in the abdomen. Passage of more gas than usual.  Walking can help get rid of the air that was put into your GI tract during the procedure and reduce the bloating. If you had a lower endoscopy (such as a colonoscopy or flexible sigmoidoscopy) you may notice spotting of blood in your stool or on the toilet paper. If you underwent a bowel prep for your procedure, you may not have a normal bowel movement for a few days.  Please Note:  You might notice some irritation and congestion in your nose or some drainage.  This is from the oxygen used during your procedure.  There is no need for concern and it should clear up in a day or so.  SYMPTOMS TO REPORT IMMEDIATELY:   Following lower endoscopy (colonoscopy or flexible sigmoidoscopy):  Excessive amounts of blood in the stool  Significant tenderness or worsening of abdominal pains  Swelling of the abdomen that is new, acute  Fever of 100F or higher  For urgent or emergent issues, a gastroenterologist can be reached at any hour by calling [phone]. Do not use MyChart messaging for urgent concerns.    DIET:  We do recommend a small meal at first, but then you may proceed to your regular diet.  Drink plenty of fluids but you should avoid alcoholic beverages for 24 hours.  ACTIVITY:  You should plan to take it easy for the rest of today and you  should NOT DRIVE or use heavy machinery until tomorrow (because of the sedation medicines used during the test).    FOLLOW UP: Our staff will call the number listed on your records 48-72 hours following your procedure to check on you and address any questions or concerns that you may have regarding the information given to you following your procedure. If we do not reach you, we will leave a message.  We will attempt to reach you two times.  During this call, we will ask if you have developed any symptoms of COVID 19. If you develop any symptoms (ie: fever, flu-like symptoms, shortness of breath, cough etc.) before then, please call [phone].  If you test positive for Covid 19 in the 2 weeks post procedure, please call and report this information to [place].    If any biopsies were taken you will be contacted by phone or by letter within the next 1-3 weeks.  Please call us at [phone] if you have not heard about the biopsies in 3 weeks.    SIGNATURES/CONFIDENTIALITY: You and/or your care partner have signed paperwork which will be entered into your electronic medical record.  These signatures attest to the fact that that the information above on your After Visit Summary has been reviewed and is understood.  Full responsibility of the confidentiality of this discharge information lies with you and/or your care-partner.

## 2020-08-30 NOTE — Progress Notes (Signed)
Pt's states no medical or surgical changes since previsit or office visit. 

## 2020-08-30 NOTE — Op Note (Signed)
[facility] Patient Name: Jeremiah [name] Procedure Date: 08/30/2020 11:10 AM MRN: [id number] Endoscopist: Mauri Pole , MD Age: 79 Referring MD:  Date of Birth: 1941-05-06 Gender: Male Account #: [id number] Procedure:                Colonoscopy Indications:              High risk colon cancer surveillance: Personal                            history of colonic polyps Medicines:                Monitored Anesthesia Care Procedure:                Pre-Anesthesia Assessment:                           - Prior to the procedure, a History and Physical                            was performed, and patient medications and                            allergies were reviewed. The patient's tolerance of                            previous anesthesia was also reviewed. The risks                            and benefits of the procedure and the sedation                            options and risks were discussed with the patient.                            All questions were answered, and informed consent                            was obtained. Prior Anticoagulants: The patient has                            taken no previous anticoagulant or antiplatelet                            agents. ASA Grade Assessment: II - A patient with                            mild systemic disease. After reviewing the risks                            and benefits, the patient was deemed in                            satisfactory condition to undergo the procedure.  After obtaining informed consent, the colonoscope                            was passed under direct vision. Throughout the                            procedure, the patient's blood pressure, pulse, and                            oxygen saturations were monitored continuously. The                            Colonoscope was introduced through the anus and                            advanced to the the ileocolonic  anastomosis. The                            colonoscopy was performed without difficulty. The                            patient tolerated the procedure well. The quality                            of the bowel preparation was good. The ileocecal                            valve, appendiceal orifice, and rectum were                            photographed. Scope In: 11:13:33 AM Scope Out: 11:28:16 AM Scope Withdrawal Time: 0 hours 12 minutes 27 seconds  Total Procedure Duration: 0 hours 14 minutes 43 seconds  Findings:                 The perianal and digital rectal examinations were                            normal.                           There was evidence of a prior end-to-side                            ileo-colonic anastomosis in the transverse colon.                            This was patent and was characterized by healthy                            appearing mucosa.                           A less than 1 mm polyp was found in the transverse  colon. The polyp was sessile. The polyp was removed                            with a cold biopsy forceps. Resection and retrieval                            were complete.                           Four sessile polyps were found in the rectum and                            descending colon. The polyps were 4 to 5 mm in                            size. These polyps were removed with a cold snare.                            Resection and retrieval were complete.                           A few small-mouthed diverticula were found in the                            sigmoid colon.                           Non-bleeding internal hemorrhoids were found during                            retroflexion. The hemorrhoids were small. Complications:            No immediate complications. Estimated Blood Loss:     Estimated blood loss was minimal. Impression:               - Patent end-to-side ileo-colonic anastomosis,                             characterized by healthy appearing mucosa.                           - One less than 1 mm polyp in the transverse colon,                            removed with a cold biopsy forceps. Resected and                            retrieved.                           - Four 4 to 5 mm polyps in the rectum and in the                            descending colon, removed with a cold snare.  Resected and retrieved.                           - Diverticulosis in the sigmoid colon.                           - Non-bleeding internal hemorrhoids. Recommendation:           - Patient has a contact number available for                            emergencies. The signs and symptoms of potential                            delayed complications were discussed with the                            patient. Return to normal activities tomorrow.                            Written discharge instructions were provided to the                            patient.                           - Resume previous diet.                           - Continue present medications.                           - Await pathology results.                           - Repeat colonoscopy in 3 - 5 years for                            surveillance based on pathology results. Mauri Pole, MD 08/30/2020 11:39:28 AM This report has been signed electronically.

## 2020-09-03 ENCOUNTER — Telehealth: Payer: Self-pay

## 2020-09-03 NOTE — Telephone Encounter (Signed)
  Follow up Call-  Call back number 08/30/2020  Post procedure Call Back phone  # [phone]  Permission to leave phone message Yes  Some recent data might be hidden     Patient questions:  Do you have a fever, pain , or abdominal swelling? No. Pain Score  0 *  Have you tolerated food without any problems? Yes.    Have you been able to return to your normal activities? Yes.    Do you have any questions about your discharge instructions: Diet   No. Medications  No. Follow up visit  No.  Do you have questions or concerns about your Care? No.  Actions: * If pain score is 4 or above: No action needed, pain <4.  1. Have you developed a fever since your procedure? no  2.   Have you had an respiratory symptoms (SOB or cough) since your procedure? no  3.   Have you tested positive for COVID 19 since your procedure no  4.   Have you had any family members/close contacts diagnosed with the COVID 19 since your procedure?  No  Pt was still asleep.  I spoke with pt's wife Jeremiah [name].  She reported her husband had no problems when d/c to home and back to normal.  maw   If yes to any of these questions please route to Joylene John, RN and Joella Prince, RN

## 2020-09-06 ENCOUNTER — Encounter: Payer: Self-pay | Admitting: Gastroenterology

## 2020-09-07 DIAGNOSIS — R7301 Impaired fasting glucose: Secondary | ICD-10-CM | POA: Diagnosis not present

## 2020-09-07 DIAGNOSIS — Z0000 Encounter for general adult medical examination without abnormal findings: Secondary | ICD-10-CM | POA: Diagnosis not present

## 2020-09-07 DIAGNOSIS — B351 Tinea unguium: Secondary | ICD-10-CM | POA: Diagnosis not present

## 2020-09-07 DIAGNOSIS — E7800 Pure hypercholesterolemia, unspecified: Secondary | ICD-10-CM | POA: Diagnosis not present

## 2020-09-07 DIAGNOSIS — I10 Essential (primary) hypertension: Secondary | ICD-10-CM | POA: Diagnosis not present

## 2020-09-07 DIAGNOSIS — F324 Major depressive disorder, single episode, in partial remission: Secondary | ICD-10-CM | POA: Diagnosis not present

## 2020-09-07 DIAGNOSIS — F4310 Post-traumatic stress disorder, unspecified: Secondary | ICD-10-CM | POA: Diagnosis not present

## 2020-09-07 DIAGNOSIS — Z86010 Personal history of colonic polyps: Secondary | ICD-10-CM | POA: Diagnosis not present

## 2020-09-07 DIAGNOSIS — Z23 Encounter for immunization: Secondary | ICD-10-CM | POA: Diagnosis not present

## 2021-01-08 DIAGNOSIS — L603 Nail dystrophy: Secondary | ICD-10-CM | POA: Diagnosis not present

## 2021-01-08 DIAGNOSIS — B351 Tinea unguium: Secondary | ICD-10-CM | POA: Diagnosis not present

## 2021-01-08 DIAGNOSIS — L84 Corns and callosities: Secondary | ICD-10-CM | POA: Diagnosis not present

## 2021-01-08 DIAGNOSIS — I70213 Atherosclerosis of native arteries of extremities with intermittent claudication, bilateral legs: Secondary | ICD-10-CM | POA: Diagnosis not present

## 2021-04-09 DIAGNOSIS — I10 Essential (primary) hypertension: Secondary | ICD-10-CM | POA: Diagnosis not present

## 2021-04-09 DIAGNOSIS — F17210 Nicotine dependence, cigarettes, uncomplicated: Secondary | ICD-10-CM | POA: Diagnosis not present

## 2021-04-09 DIAGNOSIS — F324 Major depressive disorder, single episode, in partial remission: Secondary | ICD-10-CM | POA: Diagnosis not present

## 2021-04-09 DIAGNOSIS — F4310 Post-traumatic stress disorder, unspecified: Secondary | ICD-10-CM | POA: Diagnosis not present

## 2021-04-09 DIAGNOSIS — E7800 Pure hypercholesterolemia, unspecified: Secondary | ICD-10-CM | POA: Diagnosis not present

## 2021-04-09 DIAGNOSIS — R7309 Other abnormal glucose: Secondary | ICD-10-CM | POA: Diagnosis not present

## 2021-04-09 DIAGNOSIS — Z5181 Encounter for therapeutic drug level monitoring: Secondary | ICD-10-CM | POA: Diagnosis not present

## 2021-07-26 DIAGNOSIS — B351 Tinea unguium: Secondary | ICD-10-CM | POA: Diagnosis not present

## 2021-07-26 DIAGNOSIS — I70213 Atherosclerosis of native arteries of extremities with intermittent claudication, bilateral legs: Secondary | ICD-10-CM | POA: Diagnosis not present

## 2021-07-26 DIAGNOSIS — L603 Nail dystrophy: Secondary | ICD-10-CM | POA: Diagnosis not present

## 2021-10-01 DIAGNOSIS — F17210 Nicotine dependence, cigarettes, uncomplicated: Secondary | ICD-10-CM | POA: Diagnosis not present

## 2021-10-01 DIAGNOSIS — E7800 Pure hypercholesterolemia, unspecified: Secondary | ICD-10-CM | POA: Diagnosis not present

## 2021-10-01 DIAGNOSIS — Z23 Encounter for immunization: Secondary | ICD-10-CM | POA: Diagnosis not present

## 2021-10-01 DIAGNOSIS — F4310 Post-traumatic stress disorder, unspecified: Secondary | ICD-10-CM | POA: Diagnosis not present

## 2021-10-01 DIAGNOSIS — I10 Essential (primary) hypertension: Secondary | ICD-10-CM | POA: Diagnosis not present

## 2021-10-01 DIAGNOSIS — D649 Anemia, unspecified: Secondary | ICD-10-CM | POA: Diagnosis not present

## 2021-10-01 DIAGNOSIS — R7309 Other abnormal glucose: Secondary | ICD-10-CM | POA: Diagnosis not present

## 2021-10-01 DIAGNOSIS — R7303 Prediabetes: Secondary | ICD-10-CM | POA: Diagnosis not present

## 2021-10-01 DIAGNOSIS — F324 Major depressive disorder, single episode, in partial remission: Secondary | ICD-10-CM | POA: Diagnosis not present

## 2021-12-27 DIAGNOSIS — L603 Nail dystrophy: Secondary | ICD-10-CM | POA: Diagnosis not present

## 2021-12-27 DIAGNOSIS — B351 Tinea unguium: Secondary | ICD-10-CM | POA: Diagnosis not present

## 2021-12-27 DIAGNOSIS — I739 Peripheral vascular disease, unspecified: Secondary | ICD-10-CM | POA: Diagnosis not present

## 2021-12-27 DIAGNOSIS — M792 Neuralgia and neuritis, unspecified: Secondary | ICD-10-CM | POA: Diagnosis not present

## 2022-02-10 DIAGNOSIS — L600 Ingrowing nail: Secondary | ICD-10-CM | POA: Diagnosis not present

## 2022-02-10 DIAGNOSIS — I739 Peripheral vascular disease, unspecified: Secondary | ICD-10-CM | POA: Diagnosis not present

## 2022-02-10 DIAGNOSIS — M792 Neuralgia and neuritis, unspecified: Secondary | ICD-10-CM | POA: Diagnosis not present

## 2022-04-25 DIAGNOSIS — I739 Peripheral vascular disease, unspecified: Secondary | ICD-10-CM | POA: Diagnosis not present

## 2022-04-25 DIAGNOSIS — M792 Neuralgia and neuritis, unspecified: Secondary | ICD-10-CM | POA: Diagnosis not present

## 2022-04-25 DIAGNOSIS — L600 Ingrowing nail: Secondary | ICD-10-CM | POA: Diagnosis not present

## 2022-04-25 DIAGNOSIS — L603 Nail dystrophy: Secondary | ICD-10-CM | POA: Diagnosis not present

## 2022-04-25 DIAGNOSIS — B351 Tinea unguium: Secondary | ICD-10-CM | POA: Diagnosis not present

## 2022-07-25 DIAGNOSIS — B351 Tinea unguium: Secondary | ICD-10-CM | POA: Diagnosis not present

## 2022-07-25 DIAGNOSIS — L603 Nail dystrophy: Secondary | ICD-10-CM | POA: Diagnosis not present

## 2022-07-25 DIAGNOSIS — L600 Ingrowing nail: Secondary | ICD-10-CM | POA: Diagnosis not present

## 2022-07-25 DIAGNOSIS — I739 Peripheral vascular disease, unspecified: Secondary | ICD-10-CM | POA: Diagnosis not present

## 2022-08-08 DIAGNOSIS — L03019 Cellulitis of unspecified finger: Secondary | ICD-10-CM | POA: Diagnosis not present

## 2022-08-08 DIAGNOSIS — F17210 Nicotine dependence, cigarettes, uncomplicated: Secondary | ICD-10-CM | POA: Diagnosis not present

## 2022-08-08 DIAGNOSIS — I10 Essential (primary) hypertension: Secondary | ICD-10-CM | POA: Diagnosis not present

## 2022-10-17 DIAGNOSIS — M792 Neuralgia and neuritis, unspecified: Secondary | ICD-10-CM | POA: Diagnosis not present

## 2022-10-17 DIAGNOSIS — L603 Nail dystrophy: Secondary | ICD-10-CM | POA: Diagnosis not present

## 2022-10-17 DIAGNOSIS — L600 Ingrowing nail: Secondary | ICD-10-CM | POA: Diagnosis not present

## 2022-10-17 DIAGNOSIS — I739 Peripheral vascular disease, unspecified: Secondary | ICD-10-CM | POA: Diagnosis not present

## 2022-10-17 DIAGNOSIS — B351 Tinea unguium: Secondary | ICD-10-CM | POA: Diagnosis not present

## 2023-02-12 ENCOUNTER — Encounter (HOSPITAL_COMMUNITY): Payer: Self-pay

## 2023-02-12 ENCOUNTER — Other Ambulatory Visit: Payer: Self-pay

## 2023-02-12 ENCOUNTER — Emergency Department (HOSPITAL_COMMUNITY)
Admission: EM | Admit: 2023-02-12 | Discharge: 2023-02-12 | Disposition: A | Discharge status: Home / Self Care | Payer: No Typology Code available for payment source | Attending: Emergency Medicine | Admitting: Emergency Medicine

## 2023-02-12 ENCOUNTER — Emergency Department (HOSPITAL_COMMUNITY): Payer: No Typology Code available for payment source

## 2023-02-12 DIAGNOSIS — I10 Essential (primary) hypertension: Secondary | ICD-10-CM | POA: Diagnosis not present

## 2023-02-12 DIAGNOSIS — R310 Gross hematuria: Secondary | ICD-10-CM | POA: Diagnosis not present

## 2023-02-12 DIAGNOSIS — N3090 Cystitis, unspecified without hematuria: Secondary | ICD-10-CM

## 2023-02-12 DIAGNOSIS — R58 Hemorrhage, not elsewhere classified: Secondary | ICD-10-CM | POA: Diagnosis not present

## 2023-02-12 DIAGNOSIS — F10929 Alcohol use, unspecified with intoxication, unspecified: Secondary | ICD-10-CM | POA: Diagnosis not present

## 2023-02-12 DIAGNOSIS — R319 Hematuria, unspecified: Secondary | ICD-10-CM | POA: Diagnosis not present

## 2023-02-12 LAB — URINALYSIS, ROUTINE W REFLEX MICROSCOPIC
Bacteria, UA: NONE SEEN
Bilirubin Urine: NEGATIVE
Glucose, UA: NEGATIVE mg/dL
Ketones, ur: NEGATIVE mg/dL
Leukocytes,Ua: NEGATIVE
Nitrite: NEGATIVE
Protein, ur: 100 mg/dL — AB
RBC / HPF: >50 RBC/hpf (ref 0–5)
Specific Gravity, Urine: 1.011 (ref 1.005–1.030)
WBC, UA: >50 WBC/hpf (ref 0–5)
pH: 7 (ref 5.0–8.0)

## 2023-02-12 LAB — CBC WITH DIFFERENTIAL/PLATELET
Abs Immature Granulocytes: 0.03 10*3/uL (ref 0.00–0.07)
Basophils Absolute: 0 10*3/uL (ref 0.0–0.1)
Basophils Relative: 0 %
Eosinophils Absolute: 0.1 10*3/uL (ref 0.0–0.5)
Eosinophils Relative: 0 %
HCT: 41.1 % (ref 39.0–52.0)
Hemoglobin: 13.9 g/dL (ref 13.0–17.0)
Immature Granulocytes: 0 %
Lymphocytes Relative: 23 %
Lymphs Abs: 2.7 10*3/uL (ref 0.7–4.0)
MCH: 32.6 pg (ref 26.0–34.0)
MCHC: 33.8 g/dL (ref 30.0–36.0)
MCV: 96.5 fL (ref 80.0–100.0)
Monocytes Absolute: 0.8 10*3/uL (ref 0.1–1.0)
Monocytes Relative: 7 %
Neutro Abs: 8.1 10*3/uL — ABNORMAL HIGH (ref 1.7–7.7)
Neutrophils Relative %: 70 %
Platelets: 206 10*3/uL (ref 150–400)
RBC: 4.26 MIL/uL (ref 4.22–5.81)
RDW: 12.4 % (ref 11.5–15.5)
WBC: 11.7 10*3/uL — ABNORMAL HIGH (ref 4.0–10.5)
nRBC: 0 % (ref 0.0–0.2)

## 2023-02-12 LAB — PROTIME-INR
INR: 1 (ref 0.8–1.2)
Prothrombin Time: 13.2 seconds (ref 11.4–15.2)

## 2023-02-12 LAB — BRAIN NATRIURETIC PEPTIDE: B Natriuretic Peptide: 82.4 pg/mL (ref 0.0–100.0)

## 2023-02-12 MED ORDER — TAMSULOSIN HCL 0.4 MG PO CAPS
0.4000 mg | ORAL_CAPSULE | Freq: Once | ORAL | Status: AC
  Administered 2023-02-12: 0.4 mg via ORAL
  Filled 2023-02-12: qty 1

## 2023-02-12 MED ORDER — TAMSULOSIN HCL 0.4 MG PO CAPS: 0.4000 mg | ORAL_CAPSULE | Freq: Every day | ORAL | 0 refills | Status: AC

## 2023-02-12 NOTE — ED Notes (Signed)
Pt is aware that a urine sample is needed, urinal at bedside.

## 2023-02-12 NOTE — ED Triage Notes (Addendum)
BIBA from home for Flank pain x1 day with hematuria that started this am 2 hr PTA.  Denies urinary complaints ETOH and cocaine use today PTA

## 2023-02-12 NOTE — ED Provider Notes (Signed)
[facility] Provider Note   CSN: [id number] Arrival date & time: 02/12/23  0848     History  Chief Complaint  Patient presents with   Hematuria    Jeremiah [name] is a 82 y.o. male.  HPI Adult male with a history of colectomy in the distant past presents with concern for urinary changes, hematuria, flank pain.  Symptoms have been present for several days, with worsening hematuria today.  No fever, chills, nausea, vomiting, syncope.    Home Medications Prior to Admission medications   Medication Sig Start Date End Date Taking? Authorizing Provider  tamsulosin (FLOMAX) 0.4 MG CAPS capsule Take 1 capsule (0.4 mg total) by mouth daily for 15 days. 02/12/23 02/27/23 Yes Carmin Muskrat, MD  albuterol (VENTOLIN HFA) 108 (90 Base) MCG/ACT inhaler INHALE 2 PUFFS BY MOUTH EVERY 6 HOURS AS NEEDED FOR SHORTNESS OF BREATH, WHEEZING WITH/WITHOUT COUGH 10/25/19   [provider]  amLODipine (NORVASC) 10 MG tablet TAKE ONE TABLET BY MOUTH DAILY FOR BLOOD PRESSURE HOLD DOSE FOR SYSTOLIC BLOOD PRESSURE BELOW 100; AVOID GRAPEFRUIT PRODUCTS 10/25/19   [provider]  atorvastatin (LIPITOR) 40 MG tablet Take 40 mg by mouth daily.    [provider]  Brinzolamide-Brimonidine 1-0.2 % SUSP INSTILL ONE DROP IN [place] [facility] THREE TIMES A DAY 11/24/19   [provider]  cholecalciferol (VITAMIN D) 1000 units tablet Take 3,000 Units by mouth daily.    [provider]  citalopram (CELEXA) 40 MG tablet Take 40 mg by mouth daily.    [provider]  HYDROcodone-acetaminophen (NORCO/VICODIN) 5-325 MG tablet Take 1-2 tablets by mouth every 4 (four) hours as needed for moderate pain. Patient not taking: Reported on 08/15/2020 02/12/16   Melida Quitter, MD  latanoprost (XALATAN) 0.005 % ophthalmic solution INSTILL 1 DROP IN BOTH EYES AT BEDTIME FOR GLAUCOMA 11/24/19   [provider]  lisinopril-hydrochlorothiazide  (PRINZIDE,ZESTORETIC) 20-25 MG tablet Take 1 tablet by mouth daily.    [provider]  metoprolol (LOPRESSOR) 50 MG tablet Take 100 mg by mouth daily.    [provider]  mometasone (ASMANEX) 220 MCG/INH inhaler INHALE 1 PUFF BY MOUTH AT BEDTIME (RINSE MOUTH WELL WITH WATER AFTER EACH USE) 10/25/19   [provider]  NON FORMULARY PT HAS A CPAP MACHINE    [provider]  potassium chloride (K-DUR) 10 MEQ tablet Take 10 mEq by mouth daily.    [provider]  traZODone (DESYREL) 50 MG tablet Take 50 mg by mouth at bedtime.    [provider]      Allergies    Cephalexin    Review of Systems   Review of Systems  All other systems reviewed and are negative.   Physical Exam Updated Vital Signs BP (!) 162/94   Pulse 83   Temp 98.9 F (37.2 C) (Oral)   Resp 15   Wt 88 kg   SpO2 99%   BMI 28.24 kg/m  Physical Exam Vitals and nursing note reviewed.  Constitutional:      General: He is not in acute distress.    Appearance: He is well-developed.  HENT:     Head: Normocephalic and atraumatic.  Eyes:     Conjunctiva/sclera: Conjunctivae normal.  Pulmonary:     Effort: Pulmonary effort is normal. No respiratory distress.     Breath sounds: No stridor.  Abdominal:     General: There is no distension.     Tenderness: There  is no abdominal tenderness. There is no guarding.  Skin:    General: Skin is warm and dry.     Comments: Surgical incision, well-healed, aged in appearance lower abdomen  Neurological:     Mental Status: He is alert and oriented to person, place, and time.     ED Results / Procedures / Treatments   Labs (all labs ordered are listed, but only abnormal results are displayed) Labs Reviewed  CBC WITH DIFFERENTIAL/PLATELET - Abnormal; Notable for the following components:      Result Value   WBC 11.7 (*)    Neutro Abs 8.1 (*)    All other components within normal limits  URINALYSIS, ROUTINE W REFLEX  MICROSCOPIC - Abnormal; Notable for the following components:   Color, Urine RED (*)    APPearance CLOUDY (*)    Hgb urine dipstick MODERATE (*)    Protein, ur 100 (*)    All other components within normal limits  BRAIN NATRIURETIC PEPTIDE  PROTIME-INR    EKG None  Radiology CT Renal Stone Study  Result Date: 02/12/2023 CLINICAL DATA:  Abdominal pain and flank pain.  Stone suspected. EXAM: CT ABDOMEN AND PELVIS WITHOUT CONTRAST TECHNIQUE: Multidetector CT imaging of the abdomen and pelvis was performed following the standard protocol without IV contrast. RADIATION DOSE REDUCTION: This exam was performed according to the departmental dose-optimization program which includes automated exposure control, adjustment of the mA and/or kV according to patient size and/or use of iterative reconstruction technique. COMPARISON:  None Available. FINDINGS: Lower chest: Lung bases are clear. Hepatobiliary: No focal hepatic lesion. Normal gallbladder. No biliary duct dilatation. Common bile duct is normal. Pancreas: Pancreas is normal. No ductal dilatation. No pancreatic inflammation. Spleen: Normal spleen Adrenals/urinary tract: Enlargement of the LEFT adrenal gland to 19 mm has low attenuation (HU equal -2). Findings consistent benign adenoma. RIGHT adrenal gland normal. No nephrolithiasis, ureterolithiasis or obstructive uropathy Stomach/Bowel: Stomach, duodenum small-bowel normal. Post RIGHT hemicolectomy anatomy. LEFT colon normal. Vascular/Lymphatic: Abdominal aorta is normal caliber with atherosclerotic calcification. There is no retroperitoneal or periportal lymphadenopathy. No pelvic lymphadenopathy. Reproductive: Prostate unremarkable Other: No inguinal hernia. Musculoskeletal: Degenerative change of the hip joints. No acute osseous findings IMPRESSION: 1. No nephrolithiasis, ureterolithiasis or obstructive uropathy. No explanation for flank pain. 2. Post RIGHT hemicolectomy anatomy. 3. 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 No follow-up imaging is recommended. JACR 2017 Aug; 14(8):1038-44, JCAT 2016 Mar-Apr; 40(2):194-200, Urol J 2006 Spring; 3(2):71-4. 4.  Aortic Atherosclerosis (ICD10-I70.0). Electronically Signed   By: Suzy Bouchard M.D.   On: 02/12/2023 10:57    Procedures Procedures    Medications Ordered in ED Medications  tamsulosin (FLOMAX) capsule 0.4 mg (has no administration in time range)    ED Course/ Medical Decision Making/ A&P                             Medical Decision Making Patient with a history of parotid malignancy, prior colectomy presents with new hematuria, flank pain, change in frequency.    Patient did use cocaine and alcohol earlier today.  Differential includes cystitis, infection, nephrolithiasis, pyelonephritis, malignancy.  CT scan, labs, urinalysis ordered.  Cardiac 90 sinus normal Pulse ox 100% room air normal   Amount and/or Complexity of Data Reviewed External Data Reviewed: notes.    Details: [facility] Labs: ordered. Decision-making details documented in ED Course. Radiology: ordered and independent interpretation performed. Decision-making details documented in ED  Course.  Risk Prescription drug management. Decision regarding hospitalization.   12:21 PM Patient awake, alert, in no distress, speaking clearly.  No evidence for infection, bacteremia, sepsis, no evidence for obstruction, with concern for hematuria, possible hemorrhagic cystitis, patient will be referred to our urology colleagues.  No evidence for hemodynamic compromise, patient appropriate for close outpatient follow-up and in addition to exploring urology follow-up with the [facility] he was provided contact information for local urology team as well.         Final Clinical Impression(s) / ED Diagnoses Final diagnoses:  Cystitis  Gross hematuria    Rx / DC Orders ED Discharge Orders          Ordered    tamsulosin (FLOMAX) 0.4  MG CAPS capsule  Daily        02/12/23 1221              Carmin Muskrat, MD 02/12/23 1221

## 2023-02-12 NOTE — Discharge Instructions (Signed)
As discussed, with your change in urinary frequency and the blood in your urine it is importantly follow-up with a urologist.  Please follow-up with either the urology team at your [facility] facility or call our local team here.  Please discuss need for follow-up due to cystitis with hematuria and arranging follow-up.

## 2023-04-23 DIAGNOSIS — I739 Peripheral vascular disease, unspecified: Secondary | ICD-10-CM | POA: Diagnosis not present

## 2023-04-23 DIAGNOSIS — B351 Tinea unguium: Secondary | ICD-10-CM | POA: Diagnosis not present

## 2023-04-23 DIAGNOSIS — L603 Nail dystrophy: Secondary | ICD-10-CM | POA: Diagnosis not present

## 2023-04-23 DIAGNOSIS — E1151 Type 2 diabetes mellitus with diabetic peripheral angiopathy without gangrene: Secondary | ICD-10-CM | POA: Diagnosis not present

## 2023-04-23 DIAGNOSIS — M792 Neuralgia and neuritis, unspecified: Secondary | ICD-10-CM | POA: Diagnosis not present

## 2023-04-23 DIAGNOSIS — L600 Ingrowing nail: Secondary | ICD-10-CM | POA: Diagnosis not present

## 2023-06-30 ENCOUNTER — Emergency Department (HOSPITAL_COMMUNITY): Payer: Medicare HMO

## 2023-06-30 ENCOUNTER — Emergency Department (HOSPITAL_COMMUNITY)
Admission: EM | Admit: 2023-06-30 | Discharge: 2023-06-30 | Disposition: A | Discharge status: Home / Self Care | Payer: Medicare HMO | Attending: Emergency Medicine | Admitting: Emergency Medicine

## 2023-06-30 ENCOUNTER — Encounter (HOSPITAL_COMMUNITY): Payer: Self-pay

## 2023-06-30 DIAGNOSIS — Z79899 Other long term (current) drug therapy: Secondary | ICD-10-CM | POA: Insufficient documentation

## 2023-06-30 DIAGNOSIS — E876 Hypokalemia: Secondary | ICD-10-CM

## 2023-06-30 DIAGNOSIS — M791 Myalgia, unspecified site: Secondary | ICD-10-CM | POA: Diagnosis not present

## 2023-06-30 DIAGNOSIS — R109 Unspecified abdominal pain: Secondary | ICD-10-CM

## 2023-06-30 DIAGNOSIS — M62838 Other muscle spasm: Secondary | ICD-10-CM

## 2023-06-30 LAB — URINALYSIS, ROUTINE W REFLEX MICROSCOPIC
Bacteria, UA: NONE SEEN
Bilirubin Urine: NEGATIVE
Glucose, UA: NEGATIVE mg/dL
Hgb urine dipstick: NEGATIVE
Ketones, ur: NEGATIVE mg/dL
Leukocytes,Ua: NEGATIVE
Nitrite: NEGATIVE
Protein, ur: 30 mg/dL — AB
Specific Gravity, Urine: 1.014 (ref 1.005–1.030)
pH: 7 (ref 5.0–8.0)

## 2023-06-30 LAB — CBC
HCT: 44.6 % (ref 39.0–52.0)
Hemoglobin: 15 g/dL (ref 13.0–17.0)
MCH: 33.1 pg (ref 26.0–34.0)
MCHC: 33.6 g/dL (ref 30.0–36.0)
MCV: 98.5 fL (ref 80.0–100.0)
Platelets: 216 10*3/uL (ref 150–400)
RBC: 4.53 MIL/uL (ref 4.22–5.81)
RDW: 12.3 % (ref 11.5–15.5)
WBC: 8.7 10*3/uL (ref 4.0–10.5)
nRBC: 0 % (ref 0.0–0.2)

## 2023-06-30 LAB — BASIC METABOLIC PANEL
Anion gap: 10 (ref 5–15)
BUN: 10 mg/dL (ref 8–23)
CO2: 30 mmol/L (ref 22–32)
Calcium: 8.9 mg/dL (ref 8.9–10.3)
Chloride: 98 mmol/L (ref 98–111)
Creatinine, Ser: 0.99 mg/dL (ref 0.61–1.24)
GFR, Estimated: >60 mL/min (ref >60)
Glucose, Bld: 130 mg/dL — ABNORMAL HIGH (ref 70–99)
Potassium: 3 mmol/L — ABNORMAL LOW (ref 3.5–5.1)
Sodium: 138 mmol/L (ref 135–145)

## 2023-06-30 LAB — LIPASE, BLOOD: Lipase: 32 U/L (ref 11–51)

## 2023-06-30 MED ORDER — LIDOCAINE 5 % EX PTCH: 1.0000 | MEDICATED_PATCH | CUTANEOUS | 0 refills | Status: AC

## 2023-06-30 MED ORDER — POTASSIUM CHLORIDE CRYS ER 20 MEQ PO TBCR
40.0000 meq | EXTENDED_RELEASE_TABLET | Freq: Once | ORAL | Status: AC
  Administered 2023-06-30: 40 meq via ORAL
  Filled 2023-06-30: qty 2

## 2023-06-30 MED ORDER — CYCLOBENZAPRINE HCL 5 MG PO TABS: 5.0000 mg | ORAL_TABLET | Freq: Two times a day (BID) | ORAL | 0 refills | Status: AC | PRN

## 2023-06-30 MED ORDER — VALACYCLOVIR HCL 1 G PO TABS: 1000.0000 mg | ORAL_TABLET | Freq: Three times a day (TID) | ORAL | 0 refills | Status: AC

## 2023-06-30 MED ORDER — FENTANYL CITRATE PF 50 MCG/ML IJ SOSY
50.0000 ug | PREFILLED_SYRINGE | Freq: Once | INTRAMUSCULAR | Status: AC
  Administered 2023-06-30: 50 ug via INTRAVENOUS
  Filled 2023-06-30: qty 1

## 2023-06-30 MED ORDER — CYCLOBENZAPRINE HCL 10 MG PO TABS
5.0000 mg | ORAL_TABLET | Freq: Once | ORAL | Status: AC
  Administered 2023-06-30: 5 mg via ORAL
  Filled 2023-06-30: qty 1

## 2023-06-30 NOTE — ED Triage Notes (Signed)
Pt arrived via POV, c/o left sided flank pain. Denies any dysuria or hematuria. Concern for kidney stone, hx of stones.

## 2023-06-30 NOTE — ED Notes (Signed)
Pt teaching provided on medications that may cause drowsiness. Pt instructed not to drive or operate heavy machinery while taking the prescribed medication. Pt verbalized understanding.   Pt provided discharge instructions and prescription information. Pt was given the opportunity to ask questions and questions were answered.   

## 2023-06-30 NOTE — ED Provider Notes (Signed)
[place] EMERGENCY DEPARTMENT AT [facility] Provider Note   CSN: [id number] Arrival date & time: 06/30/23  5621     History  Chief Complaint  Patient presents with   Flank Pain    GARREL STEEGE is a 82 y.o. male.  The history is provided by the patient and medical records. No language interpreter was used.  Flank Pain This is a new problem. The current episode started yesterday. The problem occurs constantly. The problem has not changed since onset.Associated symptoms include abdominal pain (l flank). Pertinent negatives include no chest pain, no headaches and no shortness of breath. Nothing aggravates the symptoms. Nothing relieves the symptoms. He has tried nothing for the symptoms. The treatment provided no relief.       Home Medications Prior to Admission medications   Medication Sig Start Date End Date Taking? Authorizing Provider  albuterol (VENTOLIN HFA) 108 (90 Base) MCG/ACT inhaler INHALE 2 PUFFS BY MOUTH EVERY 6 HOURS AS NEEDED FOR SHORTNESS OF BREATH, WHEEZING WITH/WITHOUT COUGH 10/25/19   [provider]  amLODipine (NORVASC) 10 MG tablet TAKE ONE TABLET BY MOUTH DAILY FOR BLOOD PRESSURE HOLD DOSE FOR SYSTOLIC BLOOD PRESSURE BELOW 308; AVOID GRAPEFRUIT PRODUCTS 10/25/19   [provider]  atorvastatin (LIPITOR) 40 MG tablet Take 40 mg by mouth daily.    [provider]  Brinzolamide-Brimonidine 1-0.2 % SUSP INSTILL ONE DROP IN [facility] EYE THREE TIMES A DAY 11/24/19   [provider]  cholecalciferol (VITAMIN D) 1000 units tablet Take 3,000 Units by mouth daily.    [provider]  citalopram (CELEXA) 40 MG tablet Take 40 mg by mouth daily.    [provider]  HYDROcodone-acetaminophen (NORCO/VICODIN) 5-325 MG tablet Take 1-2 tablets by mouth every 4 (four) hours as needed for moderate pain. Patient not taking: Reported on 08/15/2020 02/12/16   Christia Reading, MD  latanoprost (XALATAN) 0.005 % ophthalmic  solution INSTILL 1 DROP IN BOTH EYES AT BEDTIME FOR GLAUCOMA 11/24/19   [provider]  lisinopril-hydrochlorothiazide (PRINZIDE,ZESTORETIC) 20-25 MG tablet Take 1 tablet by mouth daily.    [provider]  metoprolol (LOPRESSOR) 50 MG tablet Take 100 mg by mouth daily.    [provider]  mometasone (ASMANEX) 220 MCG/INH inhaler INHALE 1 PUFF BY MOUTH AT BEDTIME (RINSE MOUTH WELL WITH WATER AFTER EACH USE) 10/25/19   [provider]  NON FORMULARY PT HAS A CPAP MACHINE    [provider]  potassium chloride (K-DUR) 10 MEQ tablet Take 10 mEq by mouth daily.    [provider]  traZODone (DESYREL) 50 MG tablet Take 50 mg by mouth at bedtime.    [provider]      Allergies    Cephalexin    Review of Systems   Review of Systems  Constitutional:  Negative for chills, fatigue and fever.  HENT:  Negative for congestion.   Respiratory:  Negative for cough, chest tightness and shortness of breath.   Cardiovascular:  Negative for chest pain.  Gastrointestinal:  Positive for abdominal pain (l flank), nausea and vomiting. Negative for constipation and diarrhea.  Genitourinary:  Positive for flank pain. Negative for dysuria.  Musculoskeletal:  Positive for back pain (l flank). Negative for neck pain and neck stiffness.  Skin:  Negative for rash and wound.  Neurological:  Negative for weakness, light-headedness and headaches.  Psychiatric/Behavioral:  Negative for agitation and confusion.   All other systems reviewed and are negative.   Physical Exam Updated  Vital Signs BP (!) 176/104 (BP Location: Right Arm)   Pulse 72   Temp 98 F (36.7 C) (Oral)   Resp 16   SpO2 99%  Physical Exam Vitals and nursing note reviewed.  Constitutional:      General: He is not in acute distress.    Appearance: He is well-developed. He is not ill-appearing, toxic-appearing or diaphoretic.  HENT:     Head: Normocephalic and atraumatic.     Nose:  Nose normal.     Mouth/Throat:     Mouth: Mucous membranes are moist.  Eyes:     Conjunctiva/sclera: Conjunctivae normal.  Cardiovascular:     Rate and Rhythm: Normal rate and regular rhythm.     Heart sounds: No murmur heard. Pulmonary:     Effort: Pulmonary effort is normal. No respiratory distress.     Breath sounds: Normal breath sounds. No wheezing, rhonchi or rales.  Chest:     Chest wall: No tenderness.  Abdominal:     General: Abdomen is flat. There is no distension.     Palpations: Abdomen is soft.     Tenderness: There is no abdominal tenderness. There is no right CVA tenderness, left CVA tenderness, guarding or rebound.    Musculoskeletal:        General: Tenderness present. No swelling.     Cervical back: Neck supple.       Back:  Skin:    General: Skin is warm and dry.     Capillary Refill: Capillary refill takes less than 2 seconds.     Findings: No erythema or rash.  Neurological:     General: No focal deficit present.     Mental Status: He is alert.     Motor: No weakness.  Psychiatric:        Mood and Affect: Mood normal.     ED Results / Procedures / Treatments   Labs (all labs ordered are listed, but only abnormal results are displayed) Labs Reviewed  URINALYSIS, ROUTINE W REFLEX MICROSCOPIC - Abnormal; Notable for the following components:      Result Value   APPearance HAZY (*)    Protein, ur 30 (*)    All other components within normal limits  BASIC METABOLIC PANEL - Abnormal; Notable for the following components:   Potassium 3.0 (*)    Glucose, Bld 130 (*)    All other components within normal limits  CBC  LIPASE, BLOOD    EKG None  Radiology CT Renal Stone Study  Result Date: 06/30/2023 CLINICAL DATA:  Abdominal and flank pain on the left. History of kidney stones. EXAM: CT ABDOMEN AND PELVIS WITHOUT CONTRAST TECHNIQUE: Multidetector CT imaging of the abdomen and pelvis was performed following the standard protocol without IV  contrast. RADIATION DOSE REDUCTION: This exam was performed according to the departmental dose-optimization program which includes automated exposure control, adjustment of the mA and/or kV according to patient size and/or use of iterative reconstruction technique. COMPARISON:  02/12/2023 FINDINGS: Lower chest: No active disease. Small focus of emphysema with a focal calcification at the left base. No follow-up recommended. Hepatobiliary: Liver parenchyma is normal without contrast. No calcified gallstones. Pancreas: Normal Spleen: Normal Adrenals/Urinary Tract: Redemonstration of a left adrenal adenoma measuring 19 mm in size with Hounsfield unit measurement -2. No follow-up recommended. Kidneys are normal. No mass, stone or hydronephrosis. No stone within either ureter. No stone in the bladder. Stomach/Bowel: Very small hiatal hernia. Stomach otherwise unremarkable. Small-bowel is normal. Previous partial colectomy on  the right. No complicating feature seen. Distal to that, there is diverticulosis in the sigmoid colon without evidence of diverticulitis. Vascular/Lymphatic: Aortic atherosclerosis. No aneurysm. IVC is normal. No adenopathy. Reproductive: Normal Other: No free fluid or air. Musculoskeletal: Ordinary mild spinal degenerative changes. Chronic osteoarthritis of the hips, left worse than right. IMPRESSION: 1. No acute finding by CT. No evidence of urinary tract stone disease. 2. Previous partial colectomy on the right. No complicating feature seen. Distal to that, there is diverticulosis of the sigmoid colon without evidence of diverticulitis. 3. Aortic atherosclerosis. 4. Left adrenal adenoma. No follow-up recommended. 5. Chronic osteoarthritis of the hips, left worse than right. Aortic Atherosclerosis (ICD10-I70.0). Electronically Signed   By: Paulina Fusi M.D.   On: 06/30/2023 12:34    Procedures Procedures    Medications Ordered in ED Medications  fentaNYL (SUBLIMAZE) injection 50 mcg (50 mcg  Intravenous Given 06/30/23 1149)  cyclobenzaprine (FLEXERIL) tablet 5 mg (5 mg Oral Given 06/30/23 1343)  potassium chloride SA (KLOR-CON M) CR tablet 40 mEq (40 mEq Oral Given 06/30/23 1342)    ED Course/ Medical Decision Making/ A&P                             Medical Decision Making Amount and/or Complexity of Data Reviewed Labs: ordered. Radiology: ordered.  Risk Prescription drug management.    NOLAWI BUBIER is a 82 y.o. male with a past medical history significant for hypertension, hyperlipidemia, GERD, diverticulosis, previous partial colectomy, and previous kidney stone who presents with left flank pain.  According to patient, for the last 3 days he had pain in his left flank that is hurting when he lays flat or with different positioning.  He reports it is worse when he tries to stand or walk.  He reports no trauma and reports this does not feel like his previous shingles he has had in the past.  Denies any rashes.  Denies any constipation or diarrhea but does report an episode of nausea and vomiting when the pain was severe.  He denies any urinary changes no hematuria dysuria or cloudiness.  Denies any right-sided or central abdominal pain.  He denies any fevers, chills, congestion, cough.  Denies other complaints at this time.  Reports the pain is severe at times but then at times it is gone.  On exam, lungs clear.  Chest nontender.  Abdomen is tender in the left lateral abdomen and left flank area.  Left CVA was nontender and I do not appreciate any rash.  Bowel sounds were appreciated.  No other focal deficits on my exam.  Given the patient's history of kidney stone and him thinking this feels similar, we will get a CT scan however his urinalysis did not show nitrites, leukocytes, or hemoglobin.  Less suspicion for kidney stone and more suspicion for a musculoskeletal or abdominal wall discomfort causing his pain versus something like shingles with the pain before the rash.  Patient  had screening labs and will get the CT scan.  Anticipate reassessment after workup to determine disposition.         CT scan did not show evidence of kidney stone or other concerning finding.  Labs overall reassuring.  Urinalysis did not show UTI or bleeding.  Now I suspect this is more musculoskeletal and possibly early shingles before the rash.  Will give prescription for muscle relaxant, Lidoderm patches, and valacyclovir to use if rash develops.  Patient agrees with  this plan.  We went through all the findings gather and he agrees to discharge home.  Patient discharged in good condition to follow-up with his primary team.             Final Clinical Impression(s) / ED Diagnoses Final diagnoses:  Left flank pain  Muscle spasm  Hypokalemia    Rx / DC Orders ED Discharge Orders          Ordered    lidocaine (LIDODERM) 5 %  Every 24 hours        06/30/23 1326    cyclobenzaprine (FLEXERIL) 5 MG tablet  2 times daily PRN        06/30/23 1326    valACYclovir (VALTREX) 1000 MG tablet  3 times daily        06/30/23 1337           Clinical Impression: 1. Left flank pain   2. Muscle spasm   3. Hypokalemia     Disposition: Discharge  Condition: Good  I have discussed the results, Dx and Tx plan with the pt(& family if present). He/she/they expressed understanding and agree(s) with the plan. Discharge instructions discussed at great length. Strict return precautions discussed and pt &/or family have verbalized understanding of the instructions. No further questions at time of discharge.    Discharge Medication List as of 06/30/2023  1:27 PM     START taking these medications   Details  cyclobenzaprine (FLEXERIL) 5 MG tablet Take 1 tablet (5 mg total) by mouth 2 (two) times daily as needed for muscle spasms., Starting Tue 06/30/2023, Print    lidocaine (LIDODERM) 5 % Place 1 patch onto the skin daily. Remove & Discard patch within 12 hours or as directed by MD, Starting  Tue 06/30/2023, Print        Follow Up: Renford Dills, MD [address] [place] [address] [phone]     [facility] Emergency Department at [facility] [address] [id number] mc [place] [address] [phone]       Matraca Hunkins, Canary Brim, MD 06/30/23 1435

## 2023-06-30 NOTE — Discharge Instructions (Addendum)
Your history, exam, and workup today was overall reassuring.  Your potassium was slightly low which may contribute to some of the muscle pain and spasm we suspect is causing the discomfort.  The CT scan did not show evidence of kidney stone or other acute abnormality such as diverticulitis.  We feel you are safe for discharge home but please use the medicine to help with the discomfort and spasm.  Please take the muscle relaxant and Lidoderm patches and be careful not to have a fall.  Please rest and stay hydrated.  Please follow-up with your primary doctor for reassessment of your potassium in the future.  If any symptoms change or worsen acutely, please return to the nearest emergency department.

## 2023-10-13 DIAGNOSIS — I739 Peripheral vascular disease, unspecified: Secondary | ICD-10-CM | POA: Diagnosis not present

## 2023-10-13 DIAGNOSIS — L600 Ingrowing nail: Secondary | ICD-10-CM | POA: Diagnosis not present

## 2023-10-13 DIAGNOSIS — L603 Nail dystrophy: Secondary | ICD-10-CM | POA: Diagnosis not present

## 2023-10-13 DIAGNOSIS — B351 Tinea unguium: Secondary | ICD-10-CM | POA: Diagnosis not present

## 2023-10-13 DIAGNOSIS — M792 Neuralgia and neuritis, unspecified: Secondary | ICD-10-CM | POA: Diagnosis not present

## 2023-10-13 DIAGNOSIS — E1151 Type 2 diabetes mellitus with diabetic peripheral angiopathy without gangrene: Secondary | ICD-10-CM | POA: Diagnosis not present

## 2023-10-27 ENCOUNTER — Encounter: Payer: Self-pay | Admitting: Gastroenterology

## 2024-06-02 ENCOUNTER — Encounter: Payer: Self-pay | Admitting: Podiatry

## 2024-06-02 ENCOUNTER — Ambulatory Visit: Admitting: Podiatry

## 2024-06-02 DIAGNOSIS — M79674 Pain in right toe(s): Secondary | ICD-10-CM | POA: Diagnosis not present

## 2024-06-02 DIAGNOSIS — M79675 Pain in left toe(s): Secondary | ICD-10-CM | POA: Diagnosis not present

## 2024-06-02 DIAGNOSIS — B351 Tinea unguium: Secondary | ICD-10-CM

## 2024-06-02 NOTE — Progress Notes (Signed)
 This patient presents to the office with chief complaint of long thick painful nails.  Patient says the nails are painful walking and wearing shoes.  This patient is unable to self treat.  This patient is unable to trim his nails since she is unable to reach his  nails.  he presents to the office for preventative foot care services.  General Appearance  Alert, conversant and in no acute stress.  Vascular  Dorsalis pedis and posterior tibial  pulses are  weakly palpable  bilaterally.  Capillary return is within normal limits  bilaterally. Temperature is within normal limits  bilaterally.  Neurologic  Senn-Weinstein monofilament wire test within normal limits/diminished   bilaterally. Muscle power within normal limits bilaterally.  Nails Thick disfigured discolored nails with subungual debris  from hallux to fifth toes bilaterally. No evidence of bacterial infection or drainage bilaterally.  Orthopedic  No limitations of motion  feet .  No crepitus or effusions noted.  No bony pathology or digital deformities noted.  Skin  normotropic skin with no porokeratosis noted bilaterally.  No signs of infections or ulcers noted.     Onychomycosis  Nails  B/L.  Pain in right toes  Pain in left toes  Debridement of nails both feet followed trimming the nails with dremel tool.    RTC 3 months.   Ruffin Cotton DPM

## 2024-09-02 ENCOUNTER — Ambulatory Visit: Admitting: Podiatry

## 2024-09-21 ENCOUNTER — Encounter: Payer: Self-pay | Admitting: Podiatry

## 2024-09-21 ENCOUNTER — Ambulatory Visit: Admitting: Podiatry

## 2024-09-21 DIAGNOSIS — M79675 Pain in left toe(s): Secondary | ICD-10-CM

## 2024-09-21 DIAGNOSIS — B351 Tinea unguium: Secondary | ICD-10-CM | POA: Diagnosis not present

## 2024-09-21 DIAGNOSIS — M79674 Pain in right toe(s): Secondary | ICD-10-CM

## 2024-09-21 NOTE — Progress Notes (Signed)
 This patient presents to the office with chief complaint of long thick painful nails.  Patient says the nails are painful walking and wearing shoes.  This patient is unable to self treat.  This patient is unable to trim his nails since she is unable to reach his  nails.  he presents to the office for preventative foot care services.  General Appearance  Alert, conversant and in no acute stress.  Vascular  Dorsalis pedis and posterior tibial  pulses are  weakly palpable  bilaterally.  Capillary return is within normal limits  bilaterally. Temperature is within normal limits  bilaterally.  Neurologic  Senn-Weinstein monofilament wire test within normal limits/diminished   bilaterally. Muscle power within normal limits bilaterally.  Nails Thick disfigured discolored nails with subungual debris  from hallux to fifth toes bilaterally. No evidence of bacterial infection or drainage bilaterally.  Orthopedic  No limitations of motion  feet .  No crepitus or effusions noted.  No bony pathology or digital deformities noted.  Skin  normotropic skin with no porokeratosis noted bilaterally.  No signs of infections or ulcers noted.     Onychomycosis  Nails  B/L.  Pain in right toes  Pain in left toes  Debridement of nails both feet followed trimming the nails with dremel tool.    RTC 3 months.   Ruffin Cotton DPM

## 2024-12-23 ENCOUNTER — Ambulatory Visit: Admitting: Podiatry

## 2025-01-18 ENCOUNTER — Ambulatory Visit: Admitting: Podiatry
